# Patient Record
Sex: Female | Born: 1970 | Race: White | Hispanic: No | Smoking: Never smoker
Health system: Southern US, Community
[De-identification: ages and names within clinical notes are randomized; demographics above are authoritative.]

## PROBLEM LIST (undated history)

## (undated) DIAGNOSIS — Z973 Presence of spectacles and contact lenses: Secondary | ICD-10-CM

## (undated) DIAGNOSIS — E785 Hyperlipidemia, unspecified: Secondary | ICD-10-CM

## (undated) DIAGNOSIS — Z8249 Family history of ischemic heart disease and other diseases of the circulatory system: Secondary | ICD-10-CM

## (undated) DIAGNOSIS — K5909 Other constipation: Secondary | ICD-10-CM

## (undated) DIAGNOSIS — Z8711 Personal history of peptic ulcer disease: Secondary | ICD-10-CM

## (undated) DIAGNOSIS — M1990 Unspecified osteoarthritis, unspecified site: Secondary | ICD-10-CM

## (undated) DIAGNOSIS — R011 Cardiac murmur, unspecified: Secondary | ICD-10-CM

## (undated) DIAGNOSIS — F32A Depression, unspecified: Secondary | ICD-10-CM

## (undated) DIAGNOSIS — Z8719 Personal history of other diseases of the digestive system: Secondary | ICD-10-CM

## (undated) DIAGNOSIS — R87619 Unspecified abnormal cytological findings in specimens from cervix uteri: Secondary | ICD-10-CM

## (undated) DIAGNOSIS — Z9884 Bariatric surgery status: Secondary | ICD-10-CM

## (undated) DIAGNOSIS — T7840XA Allergy, unspecified, initial encounter: Secondary | ICD-10-CM

## (undated) DIAGNOSIS — I10 Essential (primary) hypertension: Secondary | ICD-10-CM

## (undated) DIAGNOSIS — T8859XA Other complications of anesthesia, initial encounter: Secondary | ICD-10-CM

## (undated) DIAGNOSIS — K219 Gastro-esophageal reflux disease without esophagitis: Secondary | ICD-10-CM

## (undated) DIAGNOSIS — E039 Hypothyroidism, unspecified: Secondary | ICD-10-CM

## (undated) DIAGNOSIS — T4145XA Adverse effect of unspecified anesthetic, initial encounter: Secondary | ICD-10-CM

## (undated) HISTORY — DX: Gastro-esophageal reflux disease without esophagitis: K21.9

## (undated) HISTORY — DX: Depression, unspecified: F32.A

## (undated) HISTORY — DX: Unspecified osteoarthritis, unspecified site: M19.90

## (undated) HISTORY — DX: Cardiac murmur, unspecified: R01.1

## (undated) HISTORY — PX: ROUX-EN-Y GASTRIC BYPASS: SHX1104

## (undated) HISTORY — PX: CARPAL TUNNEL RELEASE: SHX101

## (undated) HISTORY — DX: Essential (primary) hypertension: I10

## (undated) HISTORY — DX: Allergy, unspecified, initial encounter: T78.40XA

## (undated) HISTORY — PX: TRANSTHORACIC ECHOCARDIOGRAM: SHX275

## (undated) HISTORY — PX: CARDIOVASCULAR STRESS TEST: SHX262

---

## 1997-11-21 ENCOUNTER — Encounter: Admission: RE | Admit: 1997-11-21 | Discharge: 1997-12-19 | Payer: Self-pay | Admitting: Obstetrics and Gynecology

## 1999-10-16 ENCOUNTER — Emergency Department (HOSPITAL_COMMUNITY): Admission: EM | Admit: 1999-10-16 | Discharge: 1999-10-16 | Payer: Self-pay | Admitting: Internal Medicine

## 2000-07-10 ENCOUNTER — Ambulatory Visit (HOSPITAL_COMMUNITY): Admission: RE | Admit: 2000-07-10 | Discharge: 2000-07-10 | Payer: Self-pay | Admitting: General Surgery

## 2000-07-10 HISTORY — PX: ANAL FISSURE REPAIR: SHX2312

## 2001-06-25 ENCOUNTER — Other Ambulatory Visit: Admission: RE | Admit: 2001-06-25 | Discharge: 2001-06-25 | Payer: Self-pay | Admitting: Gynecology

## 2001-12-21 ENCOUNTER — Other Ambulatory Visit: Admission: RE | Admit: 2001-12-21 | Discharge: 2001-12-21 | Payer: Self-pay | Admitting: Gynecology

## 2002-08-20 ENCOUNTER — Other Ambulatory Visit: Admission: RE | Admit: 2002-08-20 | Discharge: 2002-08-20 | Payer: Self-pay | Admitting: Gynecology

## 2002-10-21 HISTORY — PX: BUNIONECTOMY: SHX129

## 2003-08-10 ENCOUNTER — Encounter: Payer: Self-pay | Admitting: Surgery

## 2003-08-12 ENCOUNTER — Ambulatory Visit (HOSPITAL_COMMUNITY): Admission: RE | Admit: 2003-08-12 | Discharge: 2003-08-13 | Payer: Self-pay | Admitting: Surgery

## 2003-08-12 ENCOUNTER — Encounter (INDEPENDENT_AMBULATORY_CARE_PROVIDER_SITE_OTHER): Payer: Self-pay | Admitting: *Deleted

## 2003-08-12 ENCOUNTER — Encounter: Payer: Self-pay | Admitting: Surgery

## 2003-08-12 HISTORY — PX: LAPAROSCOPIC CHOLECYSTECTOMY: SUR755

## 2003-10-12 ENCOUNTER — Other Ambulatory Visit: Admission: RE | Admit: 2003-10-12 | Discharge: 2003-10-12 | Payer: Self-pay | Admitting: Gynecology

## 2004-10-11 ENCOUNTER — Other Ambulatory Visit: Admission: RE | Admit: 2004-10-11 | Discharge: 2004-10-11 | Payer: Self-pay | Admitting: Gynecology

## 2005-10-25 ENCOUNTER — Other Ambulatory Visit: Admission: RE | Admit: 2005-10-25 | Discharge: 2005-10-25 | Payer: Self-pay | Admitting: Gynecology

## 2006-07-14 ENCOUNTER — Ambulatory Visit (HOSPITAL_BASED_OUTPATIENT_CLINIC_OR_DEPARTMENT_OTHER): Admission: RE | Admit: 2006-07-14 | Discharge: 2006-07-14 | Payer: Self-pay | Admitting: Otolaryngology

## 2006-07-14 HISTORY — PX: NASAL SEPTOPLASTY W/ TURBINOPLASTY: SHX2070

## 2006-09-03 ENCOUNTER — Emergency Department (HOSPITAL_COMMUNITY): Admission: EM | Admit: 2006-09-03 | Discharge: 2006-09-03 | Payer: Self-pay | Admitting: Emergency Medicine

## 2006-09-19 ENCOUNTER — Encounter: Admission: RE | Admit: 2006-09-19 | Discharge: 2006-09-19 | Payer: Self-pay | Admitting: *Deleted

## 2007-03-21 ENCOUNTER — Inpatient Hospital Stay (HOSPITAL_COMMUNITY): Admission: EM | Admit: 2007-03-21 | Discharge: 2007-03-23 | Payer: Self-pay | Admitting: Emergency Medicine

## 2007-03-21 HISTORY — PX: OTHER SURGICAL HISTORY: SHX169

## 2009-01-24 ENCOUNTER — Emergency Department (HOSPITAL_COMMUNITY): Admission: EM | Admit: 2009-01-24 | Discharge: 2009-01-24 | Payer: Self-pay | Admitting: Emergency Medicine

## 2009-02-16 ENCOUNTER — Encounter: Admission: RE | Admit: 2009-02-16 | Discharge: 2009-02-16 | Payer: Self-pay | Admitting: Obstetrics and Gynecology

## 2009-02-22 ENCOUNTER — Encounter: Admission: RE | Admit: 2009-02-22 | Discharge: 2009-02-22 | Payer: Self-pay | Admitting: Obstetrics and Gynecology

## 2009-04-20 HISTORY — PX: COMBINED REDUCTION MAMMAPLASTY W/ ABDOMINOPLASTY: SUR306

## 2010-01-05 ENCOUNTER — Emergency Department (HOSPITAL_COMMUNITY): Admission: EM | Admit: 2010-01-05 | Discharge: 2010-01-05 | Payer: Self-pay | Admitting: Emergency Medicine

## 2010-02-23 ENCOUNTER — Encounter: Admission: RE | Admit: 2010-02-23 | Discharge: 2010-02-23 | Payer: Self-pay | Admitting: Obstetrics and Gynecology

## 2011-01-14 LAB — COMPREHENSIVE METABOLIC PANEL
AST: 30 U/L (ref 0–37)
Albumin: 4 g/dL (ref 3.5–5.2)
Chloride: 106 mEq/L (ref 96–112)
Creatinine, Ser: 0.84 mg/dL (ref 0.4–1.2)
GFR calc Af Amer: >60 mL/min (ref >60)
Potassium: 3.7 mEq/L (ref 3.5–5.1)
Total Bilirubin: 1.2 mg/dL (ref 0.3–1.2)
Total Protein: 7.1 g/dL (ref 6.0–8.3)

## 2011-01-14 LAB — DIFFERENTIAL
Basophils Absolute: 0 10*3/uL (ref 0.0–0.1)
Eosinophils Relative: 2 % (ref 0–5)
Lymphocytes Relative: 6 % — ABNORMAL LOW (ref 12–46)
Monocytes Absolute: 0.4 10*3/uL (ref 0.1–1.0)
Monocytes Relative: 3 % (ref 3–12)

## 2011-01-14 LAB — CBC
HCT: 42.9 % (ref 36.0–46.0)
Hemoglobin: 14.4 g/dL (ref 12.0–15.0)
Platelets: 221 10*3/uL (ref 150–400)
WBC: 13.2 10*3/uL — ABNORMAL HIGH (ref 4.0–10.5)

## 2011-01-14 LAB — CK TOTAL AND CKMB (NOT AT ARMC)
CK, MB: 2.2 ng/mL (ref 0.3–4.0)
Total CK: 97 U/L (ref 7–177)

## 2011-01-14 LAB — URINALYSIS, ROUTINE W REFLEX MICROSCOPIC
Glucose, UA: NEGATIVE mg/dL
Specific Gravity, Urine: 1.024 (ref 1.005–1.030)
pH: 6 (ref 5.0–8.0)

## 2011-01-14 LAB — POCT PREGNANCY, URINE: Preg Test, Ur: NEGATIVE

## 2011-01-14 LAB — GLUCOSE, CAPILLARY

## 2011-01-30 LAB — CBC
HCT: 39.6 % (ref 36.0–46.0)
Hemoglobin: 12.9 g/dL (ref 12.0–15.0)
RBC: 4.29 MIL/uL (ref 3.87–5.11)
RDW: 12.5 % (ref 11.5–15.5)
WBC: 7.9 10*3/uL (ref 4.0–10.5)

## 2011-01-30 LAB — URINALYSIS, ROUTINE W REFLEX MICROSCOPIC
Glucose, UA: NEGATIVE mg/dL
Ketones, ur: NEGATIVE mg/dL
Protein, ur: NEGATIVE mg/dL
Urobilinogen, UA: 0.2 mg/dL (ref 0.0–1.0)

## 2011-01-30 LAB — DIFFERENTIAL
Basophils Absolute: 0 10*3/uL (ref 0.0–0.1)
Basophils Relative: 0 % (ref 0–1)
Eosinophils Absolute: 0.3 10*3/uL (ref 0.0–0.7)
Monocytes Relative: 5 % (ref 3–12)
Neutro Abs: 5.1 10*3/uL (ref 1.7–7.7)
Neutrophils Relative %: 65 % (ref 43–77)

## 2011-01-30 LAB — COMPREHENSIVE METABOLIC PANEL
ALT: 23 U/L (ref 0–35)
Alkaline Phosphatase: 51 U/L (ref 39–117)
BUN: 11 mg/dL (ref 6–23)
CO2: 24 mEq/L (ref 19–32)
Chloride: 103 mEq/L (ref 96–112)
GFR calc non Af Amer: >60 mL/min (ref >60)
Glucose, Bld: 96 mg/dL (ref 70–99)
Potassium: 3.8 mEq/L (ref 3.5–5.1)
Sodium: 134 mEq/L — ABNORMAL LOW (ref 135–145)
Total Bilirubin: 1 mg/dL (ref 0.3–1.2)

## 2011-01-30 LAB — URINE MICROSCOPIC-ADD ON

## 2011-01-30 LAB — PREGNANCY, URINE: Preg Test, Ur: NEGATIVE

## 2011-01-30 LAB — URINE CULTURE

## 2011-03-05 NOTE — Consult Note (Signed)
NAMESAMATHA, [name]            ACCOUNT NO.:  [id number]   MEDICAL RECORD NO.:  [id number]          PATIENT TYPE:  EMS   LOCATION:  MAJO                         FACILITY:  [facility]   PHYSICIAN:  Thomas A. Cornett, M.D.DATE OF BIRTH:  1971/06/21   DATE OF CONSULTATION:  DATE OF DISCHARGE:                                 CONSULTATION   CHIEF COMPLAINT:  Abdominal pain.   HISTORY OF PRESENT ILLNESS:  The patient is a 40 year old female that I  was asked to see at the request of Dr. Preston Fleeting in consultation for  abdominal pain.  She has a one day history of initially right lower  quadrant and epigastric abdominal pain.  Now it is more under the  umbilicus.  The pain is severe in intensity and located currently at her  umbilicus with the scale of 7/10.  No associated nausea or vomiting.  She has a history of irritable bowel syndrome she states. She had no  change in her bowel or bladder function.  Her appetite has been good.  CT scan showed an incarcerated periumbilical hernia.   PAST MEDICAL HISTORY:  Irritable bowel disease.   PAST SURGICAL HISTORY:  Laparoscopic cholecystectomy.   FAMILY HISTORY:  Is positive for coronary artery disease, hypertension  and diabetes mellitus.   ALLERGIES:  Erythromycin.   MEDICATIONS:  Currently none.   REVIEW OF SYSTEMS:  Fifteen point review of systems otherwise negative.   PHYSICAL EXAM:  VITAL SIGNS:  Temperature 97.4, pulse 69, blood pressure  111/67, respiratory rate 16.  GENERAL:  White female in no apparent distress.  HEENT:  Extraocular movements are intact.  Oropharynx is moist.  NECK:  Supple.  Nontender.  No mass.  Trachea midline.  CHEST:  Clear to auscultation.  Chest wall motion is normal.  CARDIOVASCULAR:  Regular rate and rhythm without rub, murmur or gallop.  Peripheral perfusion is normal.  ABDOMEN:  There is a mass in her umbilicus which is tender and non-  reducible.  No peritonitis.  No other mass noted.  Bowel sounds are  present.  EXTREMITIES:  No clubbing, cyanosis or edema.  Motor and sensory  function is grossly intact.  NEUROLOGIC:  Glasgow coma scale was 15.  Motor and sensory function  grossly intact.   DIAGNOSTIC STUDIES:  He has a white count of 12,600, hemoglobin of 14,  platelet count 334,000.  The sodium 134, potassium 3.4, chloride 100,  CO2 of 24, BUN 18, creatinine 0.8, glucose 83.  Abdominal pelvic CT scan  are as stated above.   IMPRESSION:  Incarcerated periumbilical hernia more than likely an  incisional hernia from previous laparoscopic cholecystectomy with  incarcerated bowel.   PLAN:  I recommend exploratory laparotomy for reduction of this and  repair emergently.  I have explained the risks of procedure to include  bleeding, infection, bowel resection and subsequent more surgery.  They  understand the above and wished to proceed.      Thomas A. Cornett, M.D.  Electronically Signed     TAC/MEDQ  D:  03/21/2007  T:  03/21/2007  Job:  [id number]

## 2011-03-05 NOTE — Op Note (Signed)
[name], [name]            ACCOUNT NO.:  [id number]   MEDICAL RECORD NO.:  [id number]          PATIENT TYPE:  INP   LOCATION:  2550                         FACILITY:  [facility]   PHYSICIAN:  Maisie Fus A. Cornett, M.D.DATE OF BIRTH:  1971/10/18   DATE OF PROCEDURE:  03/21/2007  DATE OF DISCHARGE:                               OPERATIVE REPORT   PREOPERATIVE DIAGNOSIS:  Incarcerated periumbilical hernia.   POSTOPERATIVE DIAGNOSIS:  Spontaneously reduced periumbilical hernia.   PROCEDURE:  Repair of periumbilical hernia with Ventralex mesh.   SURGEON:  Harriette Bouillon, M.D.   ANESTHESIA:  General endotracheal anesthesia.   ESTIMATED BLOOD LOSS:  30 mL.   SPECIMENS:  None.   DRAINS:  None.   INDICATIONS FOR PROCEDURE:  The patient is a 40 year old female who  presents with about an 8-10-hour history of periumbilical and abdominal  pain.  By CT, she is noted to have an incarcerated periumbilical hernia,  with what appeared to be pericolonic fat and possibly part of the  transverse colon.  She had a mild white count.  On examination she was  tender.  She had no signs of obstruction.  She was brought emergently to  the operating room for repair this.  The procedure was discussed with  the patient, as well as potential complications of bleeding, infection,  injury to bowel and recurrence.  She understood and agreed to proceed.   DESCRIPTION OF PROCEDURE:  The patient was brought to the operating  suite and placed supine.  After induction of general anesthesia, the  hernia was located just below her umbilicus.  After sterile prep and  drape, an incision was made through a previous scar from a laparoscopic  cholecystectomy site.  We dissected down and I found the hernia sac and  dissected this out away from all the subcutaneous fatty tissue.  I had  to find the defect.  It was approximately 1.5 cm in maximal diameter.  I  was able to then reduce the hernia sac back in.  Of note, the  contents  reduced prior to incision.  This may have happened when they induced  her, and she had spontaneous reduction of the hernia.  In any event, I  elected not to open the sac since it had already reduced on its own.  I  use Kocher clamps to grab the fascial edge of the defect and then, used  my finger to bluntly creatinine a space below the fascia.  A 4.3-cm  Ventralex mesh was placed, the smooth side down on top of the  peritoneum.  I secured it to the fascial edge with  number 1 Novofil.  I  then closed the tissue over the mesh to cover with number 1 Novofil.  Irrigation was used and suctioned out.  Hemostasis was excellent.  I  closed the subcutaneous fat with 3-0 Vicryl.  Monocryl 4-0 was used to close the skin.  Steri-Strips and dry dressings  were applied.  All final counts of sponge, needle and instrument were  found be correct for this portion of the case.  The patient was awakened  and taken  to recovery in satisfactory condition.      Thomas A. Cornett, M.D.  Electronically Signed     TAC/MEDQ  D:  03/21/2007  T:  03/21/2007  Job:  [id number]

## 2011-03-08 NOTE — Op Note (Signed)
[name], [name]            ACCOUNT NO.:  [id number]   MEDICAL RECORD NO.:  [id number]          PATIENT TYPE:  AMB   LOCATION:  DSC                          FACILITY:  [facility]   PHYSICIAN:  Karol T. Lazarus Salines, M.D. DATE OF BIRTH:  Oct 27, 1970   DATE OF PROCEDURE:  07/14/2006  DATE OF DISCHARGE:                                 OPERATIVE REPORT   PREOPERATIVE DIAGNOSIS:  1. Nasal septal deviation.  2. Anterior septal perforation.  3. Bilateral hypertrophic inferior turbinates.   POSTOPERATIVE DIAGNOSIS:  1. Nasal septal deviation.  2. Anterior septal perforation.  3. Bilateral hypertrophic inferior turbinates.   PROCEDURE PERFORMED:  1. Nasal septoplasty.  2. Repair nasal septal perforation with local rotation and transposition      flaps.  3. Bilateral submucous resection inferior turbinates.   SURGEON:  Gloris Manchester. Lazarus Salines, M.D.   ANESTHESIA:  General orotracheal.   BLOOD LOSS:  Minimal.   COMPLICATIONS:  None.   FINDINGS:  Overall rightward septal deviation with relatively thin  quadrangular cartilage.  A 1-1.2 cm clean perforation of the anterior  inferior quadrangular cartilage and nasal septum.  Moderate sized inferior  turbinates and overall narrow nose.   PROCEDURE:  With the patient in a comfortable supine position, general  orotracheal anesthesia was induced without difficulty.  At an appropriate  level, the patient was placed in a slight sitting position.  The head was  rotated to the right for access to the nose.  A saline moistened throat pack  was placed.  Nasal vibrissae were trimmed.  Cocaine crystals, 160 mg total,  were applied on cotton carriers to the anterior ethmoid and sphenopalatine  ganglion regions on both sides.  Cocaine solution, 160 mg were applied, on  1/2 x 3 inch cottonoids to both sides of the septal mucosa.  1% Xylocaine  with 1:100,000 epinephrine, 10 mL total, was infiltrated into the anterior  floor of the nose, into the membranous  columella, and into the anterior  submucoperichondrial plane of the nasal septum on both sides.  A sterile  preparation and draping of the mid face was accomplished.   The materials were removed from the nose and observed to be intact and  correct in number.  The nose was carefully inspected with the findings as  described above.  A left-sided hemitransfixion incision was sharply executed  and carried down to the caudal edge of the quadrangular cartilage and  continued onto a floor incision.  A small right anterior floor incision was  executed.  Floor tunnels were elevated on both sides, carried posteriorly in  the floor of the nose, and brought up onto the vomer and brought forward.  The submucoperichondrial plane of the left anterior septum was elevated and  the perforation was dissected around its periphery and then the dissection  was carried into the perforation proper.  Dissection was carried further  posteriorly beyond the perforation on the quadrangular cartilage then up  onto the perpendicular plate of ethmoid.  The dissection was carried up to  the vault of the nose and down to the floor the nose and then brought  forward along the maxillary crest.  Assessment of the perforation was now  further possible.  2 mm of quadrangular cartilage along the maxillary crest  was incised to remove moderately heavy spurring in this locale.  This  communicated with the inferior edge of the perforation.  A tongue blade  shaped piece of quadrangular cartilage posterior to the perforation was  harvested back into the perpendicular plate of the ethmoid and was saved for  later use.  The contour ethmoid junction was identified and the opposite  submucoperiosteal plane of the posterior septum was elevated up to the vault  of the nose and back down to the floor.  The superior perpendicular plate of  ethmoid was lysed with an open Jansen-Middleton forceps and more inferior  portions were submucosally  dissected and then rocked free with Takahashi  forceps or with the closed Jansen-Middleton forceps.  Special attention was  placed in the region of nasion to allow the septum to swing more freely in  the midline without collapsing.  The maxillary crest was narrowed using a  sharp osteotome, removing the wings on both sides but was not lowered in its  total vertical height.  At this point, the septum was relatively straight  and could be swung into the midline.  Attention to the perforation was now  possible.  The submucoperichondrial plane of the right septum was elevated  from the perforation backwards and down to communicate with the floor  tunnel.  A linear incision was made in the floor of the nose along the  maxillary crest and then a relaxing incision approximately 1 cm in front of  the sphenoid rostrum was made vertically allowing this flap to rotate  forward.  It was rotated forward and with minimal tension, the perforation  was closed with interrupted 4-0 chromic suture.  A raw space was left in the  floor of the nose and on the posterior septum.   Addressing the left side, transposition flaps superiorly and inferiorly were  felt indicated.  A linear incision was made in the vault of the nose and  dissected into the septal tunnel.  A linear incision was made in the lateral  wall of the nose in the inferior meatus and this mucosa was dissected upward  so it could be transposed medially.  These two flaps were brought together  and closed across the perforation in a linear fashion horizontally.  There  was minimal tension.  Again, a raw space was left in the vault the nose and  also in the inferior meatus.   Prior to closing the left-sided perforation, the tongue of cartilage which  had been harvested was fashioned to fit into the residual cartilage of the  perforation and was placed.  The bony perpendicular plate portion was removed and discarded.  The cartilage was secured to the  remaining  quadrangular cartilage with interrupted 4-0 chromic sutures in the standard  fashion.  At this point, the septum was intact lying in the midline and did  not seem to need to be secured.  After completing both septal flaps, the  anterior incisions were closed with interrupted 4-0 chromic suture.  The  septum was quilted with running simple 5-0 plain gut stitch.  Adequate  septoplasty and closure of the perforation was felt to have been  accomplished.   Just prior to completing the septoplasty, the inferior turbinates were  infiltrated with a total of 3 mL each 1% Xylocaine with 1:100,000  epinephrine.  After  completing the septoplasty, beginning on the right side,  the anterior hood of the inferior turbinate was sharply incised just behind  the nasal valve.  The medial mucosa of the turbinate was incised in an  anterior upsloping fashion.  A laterally based flap was developed.  The  turbinate was infractured. Using angled turbinate scissors, the turbinate  anterior pole bone and mucosa was resected in a posterior downsloping  fashion leaving virtually all the posterior pole and taking virtually all  the anterior pole.  Bony spicules were removed for more prompt healing.  The  flap was laid back down.  The bulbous posterior pole of the cut mucosal  edges were suction coagulated..  The turbinate was outfractured.  This  completed the right inferior turbinate resection.  After completing the  right side, the left side was done in an identical fashion.  At this point  the turbinate reduction and septoplasty was completed.  The septum was  straight and in the midline.  Hemostasis was observed.   0.04 reinforced Silastic splints were fashioned and placed against the nasal  septum on both sides and secured anteriorly with a 3-0 Ethilon stitch.  Triple thickness Telfa packs impregnated with Neosporin ointment were  fashioned and placed low in the nose on both sides to support the  septum and  to provide hemostasis against the turbinates.  At this point, the procedure  was completed.  The pharynx was suctioned clean and the throat pack was  removed.  Again, hemostasis was observed.  The patient was returned to  anesthesia, awakened, extubated, and transferred to recovery in stable  condition.   COMMENT:  40 year old white female with a presumed traumatic anterior septal  perforation was the indication for today's procedure.  Anticipated routine  postoperative recovery with attention to analgesia, antibiosis, ice, and  elevation.  We will remove the nasal packing in one day and the septal  splints in ten days.  Given the low anticipated risk of postanesthetic or  postsurgical complications, I feel an outpatient venue is appropriate.      Gloris Manchester. Lazarus Salines, M.D.  Electronically Signed     KTW/MEDQ  D:  07/14/2006  T:  07/15/2006  Job:  [id number]   cc:   Marcene Duos, M.D.

## 2011-03-08 NOTE — Op Note (Signed)
NAMESEANNE, [name]                        ACCOUNT NO.:  [id number]   MEDICAL RECORD NO.:  [id number]                   PATIENT TYPE:  OIB   LOCATION:  2550                                 FACILITY:  [facility]   PHYSICIAN:  Velora Heckler, M.D.                DATE OF BIRTH:  1971-05-01   DATE OF PROCEDURE:  08/12/2003  DATE OF DISCHARGE:                                 OPERATIVE REPORT   PREOPERATIVE DIAGNOSIS:  Symptomatic cholelithiasis.   POSTOPERATIVE DIAGNOSIS:  Symptomatic cholelithiasis.   PROCEDURE:  Laparoscopic cholecystectomy  with intraoperative  cholangiography.   SURGEON:  Velora Heckler, M.D.   ASSISTANT:  Anselm Pancoast. Zachery Dakins, M.D.   ANESTHESIA:  General per Judie Petit, M.D.   ESTIMATED BLOOD LOSS:  Minimal.   PREPARATION:  Betadine.   COMPLICATIONS:  None.   INDICATIONS FOR PROCEDURE:  The patient is a 40 year old white female who  presents with a history of  abdominal pain. The patient had undergone  ultrasound at [facility] demonstrating numerous gallstones. The  patient had had a slight elevation of her total bilirubin, but other  liver  function tests were normal. The patient now comes to surgery for  cholecystectomy.   DESCRIPTION OF PROCEDURE:  The procedure was done in operating room #17 at  the [facility]. [facility]. The patient was brought to the  operating room and placed in the supine position on the operating table.  Following  administration of general anesthesia the patient is prepped and  draped in the usual strict aseptic fashion.   After ascertaining that an adequate level of anesthesia had been obtained,  an infraumbilical incision was made in the midline with a #15 blade.  Dissection was carried down to the fascia. The fascia was incised and the  peritoneal cavity was entered cautiously. A 0 Vicryl pursestring suture was  placed in the fascia. An Hasson cannula was introduced under direct vision  and  secured with a pursestring suture. The abdomen is insufflated with CO2.   The laparoscope was introduced and the abdomen is explored. Operative ports  were placed along the right costal margin in the midline, midclavicular  line, and the anterior axillary line. The fundus of the gallbladder  is  grasped and retracted  cephalad. Dissection is begun at the neck of  the  gallbladder.   The cystic duct is dissected out along its length. It is quite small. A clip  is placed at the neck  of the gallbladder. The cystic duct is incised. The  clip cholangiography catheter is introduced through a stab wound in the  right upper quadrant. The catheter barely fits in the lumen of the cystic  duct. It is secured with a Ligaclip.   Using C-arm fluoroscopy real time cholangiography is performed. The cystic  duct enters the right hepatic duct as an anatomic variant. Otherwise the  cholangiogram  was normal with  no filling defects or evidence of  obstruction.   The clip and catheter are withdrawn. The cystic duct is triply clipped and  divided. The anterior branch of the cystic artery is dissected out, doubly  clipped and divided. The posterior  branch of the cystic artery is dissected  out, doubly clipped and divided.   The gallbladder  is then  excised from the gallbladder  bed using the hook  electrocautery for hemostasis. The gallbladder  is completely excised and  placed into an EndoCatch bag. It is withdrawn through the umbilical port  without difficulty. A 0 Vicryl pursestring suture  is tied securely.   The right upper quadrant is copiously irrigated with warm saline which is  evacuated. Good hemostasis is noted. Pneumoperitoneum is released and ports  are removed. The port sites are inspected for good hemostasis. All port  sites were anesthetized with local anesthetic. All wounds were closed with  interrupted 4-0 Vicryl subcuticular sutures. The wounds are washed and dried  and Steri-Strips  are applied. Sterile gauze dressings are applied.   The patient was awakened from anesthesia and brought to the recovery room in  stable condition. The patient tolerated the procedure well.                                                 Velora Heckler, M.D.    TMG/MEDQ  D:  08/12/2003  T:  08/12/2003  Job:  [id number]   cc:   Marcene Duos, M.D.  [address] [place]  [place] [address]  Fax: [phone]

## 2011-03-08 NOTE — Op Note (Signed)
[facility]  Patient:    [name], [name]                     MRN: [id number] Proc. Date: 07/10/00 Adm. Date:  [id number] Disc. Date: [id number] Attending:  Carson Myrtle                           Operative Report  PREOPERATIVE DIAGNOSIS:  Anal fissure.  POSTOPERATIVE DIAGNOSIS:  Anal fissure.  PROCEDURE:  Repair of anal fissure.  SURGEON:  Kendrick Ranch, M.D.  ANESTHESIA:  General.  HISTORY OF PRESENT ILLNESS:  This 41 year old Caucasian female has had problems with constipation, difficult bowel movements and a recent pregnancy resulting in hemorrhoids and fissure. The hemorrhoids are quiescent, the fissure has been persistent. She has developed mild stenosis and spasm. She has been carefully counseled a number of times in the office and she wishes to proceed with surgery as has been carefully explained to her.  DESCRIPTION OF PROCEDURE:  The patient was taken to the operating room, placed supine, LMA anesthesia provided. She was placed in lithotomy, perianal area inspected, prepped and draped in the usual fashion. 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 The anus was injected round and about with 0.5% Marcaine with epinephrine and massaged in well. Anoscopy was completed. The liquid stool was removed. With digital exam, the internal sphincter was easily separable from the external and then placing a 15 blade percutaneously, the internal sphincter was divided. This allowed for easy stretch of the anus. The fissure was debrided and cauterized. We had elected not to treat the hemorrhoids and we did not do so today. There was no other pathology. The procedure was complete. Gelfoam gauze and dry sterile dressing applied. The patient tolerated the procedure well and was removed to the recovery room in good  condition.  Written and verbal instructions including Percocet #36 were given. She will be seen and followed as an outpatient. DD:  07/10/00 TD:  07/12/00 Job: [id number]

## 2011-06-28 ENCOUNTER — Other Ambulatory Visit: Payer: Self-pay | Admitting: Obstetrics and Gynecology

## 2011-06-28 DIAGNOSIS — N6325 Unspecified lump in the left breast, overlapping quadrants: Secondary | ICD-10-CM

## 2011-07-01 ENCOUNTER — Ambulatory Visit
Admission: RE | Admit: 2011-07-01 | Discharge: 2011-07-01 | Disposition: A | Discharge status: Home / Self Care | Payer: BC Managed Care – PPO | Source: Ambulatory Visit | Attending: Obstetrics and Gynecology | Admitting: Obstetrics and Gynecology

## 2011-07-01 ENCOUNTER — Other Ambulatory Visit: Payer: Self-pay | Admitting: Obstetrics and Gynecology

## 2011-07-01 DIAGNOSIS — N6325 Unspecified lump in the left breast, overlapping quadrants: Secondary | ICD-10-CM

## 2011-08-08 LAB — COMPREHENSIVE METABOLIC PANEL
ALT: 21
BUN: 4 — ABNORMAL LOW
Calcium: 8.7
Glucose, Bld: 97
Sodium: 139
Total Protein: 5.1 — ABNORMAL LOW

## 2011-08-08 LAB — CBC
HCT: 30.4 — ABNORMAL LOW
Hemoglobin: 11.5 — ABNORMAL LOW
MCHC: 34.3
MCV: 89
Platelets: 220
RBC: 3.41 — ABNORMAL LOW
RDW: 12.1
WBC: 7.1

## 2011-12-19 ENCOUNTER — Ambulatory Visit (INDEPENDENT_AMBULATORY_CARE_PROVIDER_SITE_OTHER): Payer: BC Managed Care – PPO | Admitting: Obstetrics and Gynecology

## 2011-12-19 DIAGNOSIS — Z01419 Encounter for gynecological examination (general) (routine) without abnormal findings: Secondary | ICD-10-CM

## 2011-12-31 ENCOUNTER — Other Ambulatory Visit (INDEPENDENT_AMBULATORY_CARE_PROVIDER_SITE_OTHER): Payer: BC Managed Care – PPO

## 2011-12-31 DIAGNOSIS — R5381 Other malaise: Secondary | ICD-10-CM

## 2012-02-06 ENCOUNTER — Ambulatory Visit (INDEPENDENT_AMBULATORY_CARE_PROVIDER_SITE_OTHER): Payer: BC Managed Care – PPO | Admitting: Obstetrics and Gynecology

## 2012-02-06 ENCOUNTER — Encounter: Payer: Self-pay | Admitting: Obstetrics and Gynecology

## 2012-02-06 VITALS — BP 112/74 | HR 76 | Wt 198.0 lb

## 2012-02-06 DIAGNOSIS — N951 Menopausal and female climacteric states: Secondary | ICD-10-CM

## 2012-02-06 DIAGNOSIS — Z9884 Bariatric surgery status: Secondary | ICD-10-CM | POA: Insufficient documentation

## 2012-02-06 DIAGNOSIS — R5381 Other malaise: Secondary | ICD-10-CM

## 2012-02-06 DIAGNOSIS — R5383 Other fatigue: Secondary | ICD-10-CM

## 2012-02-06 DIAGNOSIS — E669 Obesity, unspecified: Secondary | ICD-10-CM

## 2012-02-06 MED ORDER — NUVARING 0.12-0.015 MG/24HR VA RING: VAGINAL_RING | VAGINAL | Status: DC

## 2012-02-06 MED ORDER — INOSITOL-FOLIC ACID 2000-200 MG-MCG PO PACK: 1.0000 | PACK | Freq: Every day | ORAL | Status: DC

## 2012-02-06 NOTE — Progress Notes (Signed)
Problems:  Fatigue                    Menopausal sx  VS:  Reviewed  Labs:  Reviewed with patient            Of note, Estradiol <11, in menopausal range  Impression:Perimenopausal labs and sx  Rec:  Continuous Nuvaring           T3, T4 per patient request            Increase exercise.           FU 3 months

## 2012-02-19 ENCOUNTER — Telehealth: Payer: Self-pay | Admitting: Obstetrics and Gynecology

## 2012-02-19 NOTE — Telephone Encounter (Signed)
Tc to pt. Told pt Free T3 and T4, and T3 uptake-wnl. Will consult with vph rgdg any additional recs. Pt voices understanding.

## 2012-02-19 NOTE — Telephone Encounter (Signed)
Routed to chandra 

## 2012-02-26 ENCOUNTER — Telehealth: Payer: Self-pay

## 2012-02-26 NOTE — Telephone Encounter (Signed)
Tc to pt per vph recs. Told pt to cont Nuvaring continuously, increase exercise, and fu with vph in 3 months per last ov recs. Pt voices understanding. Pt on recall list for reminder in 04/2012 for fu.

## 2012-06-18 ENCOUNTER — Telehealth: Payer: Self-pay | Admitting: Obstetrics and Gynecology

## 2012-06-18 MED ORDER — NUVARING 0.12-0.015 MG/24HR VA RING: VAGINAL_RING | VAGINAL | Status: DC

## 2012-06-18 NOTE — Telephone Encounter (Signed)
TRIAGE/BC REQ °

## 2012-06-18 NOTE — Telephone Encounter (Signed)
Tc to pt per telephone call. Rx Nuvaring on file e-pres to pharm on file due to pt losing rx given in 01/2012. Pt has not missed any ring insertions. Pt has been using Nuvaring as directed due to having rf's left from a prev rx. Pt voices understanding.

## 2012-06-18 NOTE — Telephone Encounter (Signed)
[facility] pt 

## 2012-07-27 ENCOUNTER — Emergency Department (HOSPITAL_COMMUNITY): Payer: BC Managed Care – PPO

## 2012-07-27 ENCOUNTER — Encounter (HOSPITAL_COMMUNITY): Payer: Self-pay | Admitting: Emergency Medicine

## 2012-07-27 ENCOUNTER — Observation Stay (HOSPITAL_COMMUNITY)
Admission: EM | Admit: 2012-07-27 | Discharge: 2012-07-28 | Disposition: A | Discharge status: Home / Self Care | Payer: BC Managed Care – PPO | Attending: Internal Medicine | Admitting: Internal Medicine

## 2012-07-27 DIAGNOSIS — R110 Nausea: Secondary | ICD-10-CM | POA: Insufficient documentation

## 2012-07-27 DIAGNOSIS — E785 Hyperlipidemia, unspecified: Secondary | ICD-10-CM | POA: Insufficient documentation

## 2012-07-27 DIAGNOSIS — I2510 Atherosclerotic heart disease of native coronary artery without angina pectoris: Secondary | ICD-10-CM | POA: Insufficient documentation

## 2012-07-27 DIAGNOSIS — R079 Chest pain, unspecified: Principal | ICD-10-CM | POA: Insufficient documentation

## 2012-07-27 DIAGNOSIS — Z8249 Family history of ischemic heart disease and other diseases of the circulatory system: Secondary | ICD-10-CM | POA: Insufficient documentation

## 2012-07-27 HISTORY — DX: Hyperlipidemia, unspecified: E78.5

## 2012-07-27 LAB — CREATININE, SERUM
Creatinine, Ser: 0.91 mg/dL (ref 0.50–1.10)
GFR calc non Af Amer: 76 mL/min — ABNORMAL LOW (ref >90)

## 2012-07-27 LAB — TROPONIN I: Troponin I: <0.3 ng/mL (ref <0.30)

## 2012-07-27 LAB — CBC
Hemoglobin: 13.6 g/dL (ref 12.0–15.0)
MCH: 30.2 pg (ref 26.0–34.0)
MCHC: 34.3 g/dL (ref 30.0–36.0)
RDW: 12.4 % (ref 11.5–15.5)

## 2012-07-27 LAB — CBC WITH DIFFERENTIAL/PLATELET
Basophils Absolute: 0.1 10*3/uL (ref 0.0–0.1)
Basophils Relative: 1 % (ref 0–1)
MCHC: 33.4 g/dL (ref 30.0–36.0)
Monocytes Absolute: 0.3 10*3/uL (ref 0.1–1.0)
Neutro Abs: 7.6 10*3/uL (ref 1.7–7.7)
Neutrophils Relative %: 78 % — ABNORMAL HIGH (ref 43–77)
RDW: 12.5 % (ref 11.5–15.5)

## 2012-07-27 LAB — POCT I-STAT TROPONIN I

## 2012-07-27 LAB — COMPREHENSIVE METABOLIC PANEL
ALT: 23 U/L (ref 0–35)
Alkaline Phosphatase: 82 U/L (ref 39–117)
CO2: 23 mEq/L (ref 19–32)
GFR calc Af Amer: >90 mL/min (ref >90)
GFR calc non Af Amer: 85 mL/min — ABNORMAL LOW (ref >90)
Glucose, Bld: 89 mg/dL (ref 70–99)
Potassium: 4.3 mEq/L (ref 3.5–5.1)
Sodium: 139 mEq/L (ref 135–145)
Total Bilirubin: 0.6 mg/dL (ref 0.3–1.2)

## 2012-07-27 LAB — CK TOTAL AND CKMB (NOT AT ARMC): Total CK: 82 U/L (ref 7–177)

## 2012-07-27 LAB — MAGNESIUM: Magnesium: 2.2 mg/dL (ref 1.5–2.5)

## 2012-07-27 MED ORDER — NITROGLYCERIN 2 % TD OINT
0.5000 [in_us] | TOPICAL_OINTMENT | Freq: Four times a day (QID) | TRANSDERMAL | Status: DC
  Administered 2012-07-27 – 2012-07-28 (×4): 0.5 [in_us] via TOPICAL
  Filled 2012-07-27: qty 30
  Filled 2012-07-27: qty 1

## 2012-07-27 MED ORDER — ONDANSETRON HCL 4 MG PO TABS: 4.0000 mg | ORAL_TABLET | Freq: Four times a day (QID) | ORAL | Status: DC | PRN

## 2012-07-27 MED ORDER — DOCUSATE SODIUM 100 MG PO CAPS
100.0000 mg | ORAL_CAPSULE | Freq: Two times a day (BID) | ORAL | Status: DC
  Administered 2012-07-27 – 2012-07-28 (×2): 100 mg via ORAL
  Filled 2012-07-27 (×3): qty 1

## 2012-07-27 MED ORDER — ASPIRIN EC 81 MG PO TBEC
81.0000 mg | DELAYED_RELEASE_TABLET | Freq: Every day | ORAL | Status: DC
  Administered 2012-07-28: 81 mg via ORAL
  Filled 2012-07-27 (×2): qty 1

## 2012-07-27 MED ORDER — PANTOPRAZOLE SODIUM 40 MG PO TBEC
40.0000 mg | DELAYED_RELEASE_TABLET | Freq: Every day | ORAL | Status: DC
  Administered 2012-07-27: 40 mg via ORAL
  Filled 2012-07-27: qty 1

## 2012-07-27 MED ORDER — ACETAMINOPHEN 325 MG PO TABS
650.0000 mg | ORAL_TABLET | Freq: Four times a day (QID) | ORAL | Status: DC | PRN
  Administered 2012-07-28: 650 mg via ORAL
  Filled 2012-07-27: qty 2

## 2012-07-27 MED ORDER — SODIUM CHLORIDE 0.9 % IJ SOLN: 3.0000 mL | Freq: Two times a day (BID) | INTRAMUSCULAR | Status: DC

## 2012-07-27 MED ORDER — ATORVASTATIN CALCIUM 20 MG PO TABS
20.0000 mg | ORAL_TABLET | Freq: Every day | ORAL | Status: DC
  Administered 2012-07-27 – 2012-07-28 (×2): 20 mg via ORAL
  Filled 2012-07-27 (×2): qty 1

## 2012-07-27 MED ORDER — MORPHINE SULFATE 2 MG/ML IJ SOLN: 2.0000 mg | INTRAMUSCULAR | Status: DC | PRN

## 2012-07-27 MED ORDER — SODIUM CHLORIDE 0.9 % IV SOLN
INTRAVENOUS | Status: DC
  Administered 2012-07-27: 20 mL/h via INTRAVENOUS

## 2012-07-27 MED ORDER — ACETAMINOPHEN 650 MG RE SUPP: 650.0000 mg | Freq: Four times a day (QID) | RECTAL | Status: DC | PRN

## 2012-07-27 MED ORDER — ALPRAZOLAM 0.25 MG PO TABS
0.2500 mg | ORAL_TABLET | Freq: Two times a day (BID) | ORAL | Status: DC | PRN
  Administered 2012-07-27: 0.25 mg via ORAL
  Filled 2012-07-27: qty 1

## 2012-07-27 MED ORDER — ZOLPIDEM TARTRATE 5 MG PO TABS
10.0000 mg | ORAL_TABLET | Freq: Every day | ORAL | Status: DC
  Administered 2012-07-27: 10 mg via ORAL
  Filled 2012-07-27: qty 2

## 2012-07-27 MED ORDER — METOPROLOL TARTRATE 25 MG PO TABS
25.0000 mg | ORAL_TABLET | Freq: Two times a day (BID) | ORAL | Status: DC
  Administered 2012-07-27 – 2012-07-28 (×2): 25 mg via ORAL
  Filled 2012-07-27 (×3): qty 1

## 2012-07-27 MED ORDER — NITROGLYCERIN 0.4 MG SL SUBL
0.4000 mg | SUBLINGUAL_TABLET | SUBLINGUAL | Status: DC | PRN
  Administered 2012-07-28: 0.4 mg via SUBLINGUAL

## 2012-07-27 MED ORDER — KETOROLAC TROMETHAMINE 30 MG/ML IJ SOLN
30.0000 mg | Freq: Four times a day (QID) | INTRAMUSCULAR | Status: DC
  Administered 2012-07-27 – 2012-07-28 (×3): 30 mg via INTRAVENOUS
  Filled 2012-07-27 (×7): qty 1

## 2012-07-27 MED ORDER — ONDANSETRON HCL 4 MG/2ML IJ SOLN: 4.0000 mg | Freq: Four times a day (QID) | INTRAMUSCULAR | Status: DC | PRN

## 2012-07-27 MED ORDER — HEPARIN SODIUM (PORCINE) 5000 UNIT/ML IJ SOLN
5000.0000 [IU] | Freq: Three times a day (TID) | INTRAMUSCULAR | Status: DC
  Administered 2012-07-27 – 2012-07-28 (×3): 5000 [IU] via SUBCUTANEOUS
  Filled 2012-07-27 (×5): qty 1

## 2012-07-27 MED ORDER — INFLUENZA VIRUS VACC SPLIT PF IM SUSP
0.5000 mL | INTRAMUSCULAR | Status: DC
Start: 2012-07-28 — End: 2012-07-28
  Filled 2012-07-27: qty 0.5

## 2012-07-27 NOTE — ED Provider Notes (Signed)
MSE was initiated and I personally evaluated the patient at  5:19 PM on July 27, 2012.  The patient appears stable, and Dr. Rennis Golden is here seeing the patient and will admit her.  She had presented with chest pain and negative troponin. Lungs are clear and heart has regular rate and rhythm with 1-2/6 systolic ejection murmur heard at the cardiac base. She is currently pain free and in no distress.   Dione Booze, MD 07/27/12 [phone]

## 2012-07-27 NOTE — H&P (Signed)
Pt. Seen and examined. Agree with the NP/PA-C note as written. 41 yo female with few cardiac risk factors, + anxiety, recent palpitations and family history of CAD. Nausea, right arm pain and right chest pain. Pain was sharp. She is perimenopausal and having hot flashes. She was scheduled for a stress test tomorrow in the office, but presented to the ER due to ongoing concern for chest pain.  Initial evaluation reveals negative cardiac enzymes, EKG without ischemic changes. Plan R/O ACS overnight and CT angiogram tomorrow morning.   Chrystie Nose, MD, [facility] Attending Cardiologist [facility]

## 2012-07-27 NOTE — H&P (Signed)
[name] is an 41 y.o. female.   Chief Complaint: chest pain  HPI: [name] presented to ER due chest pain that increased today. Over last 3-4 weeks she has had substernal chest discomfort.  Today it increased and she had mild nausea and some discomfort in rt. Arm.  Mostly felt like a chest pressure/tightness, then she did have occ. Sharp pain.  She also complained of tachycardia. With her family history she and Dr. Allyson Garza were concerned about CAD, she had been scheduled for a Met Test in th AM.  Now in the ER without acute changes in EKG, initial cardiac troponin is negative.    Chest discomfort is ongoing.  Dr. Rennis Garza has seen the patient and discussed other testing than met test.  Plan is for cardiac CT in the Am.  He believes her to be at lower risk for CAD, but with current pain she would not tolerate a met test or treadmill study.  We will admit to obs.  Add PPI, IV toradol and lopressor, along with NTG paste.   Will use subcu Heparin for Vte prophylaxis.     Past Medical History  Diagnosis Date  . Abnormal Pap smear   . High cholesterol   . Chest pain at rest, with strong family history of CAD. 07/27/2012  . Hyperlipidemia 07/27/2012    Past Surgical History  Procedure Date  . Cholecystectomy     2003  . Hernia repair     2008  . Foot surgery     2004  . Carpal tunnel release     2001  . Cosmetic surgery     2009    Family History  Problem Relation Age of Onset  . Diabetes Paternal Grandfather   . Breast cancer Paternal Grandmother   . Breast cancer Maternal Grandmother   . Diabetes Father   . Coronary artery disease Father   . Coronary artery disease Mother    Social History:  reports that she has never smoked. She has never used smokeless tobacco. She reports that she does not drink alcohol or use illicit drugs. 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  Allergies: No Known Allergies  Outpatient Medications: Nuvaring-vaginal  ring Lipitor was increased to 40 mg daily in the office  Prilosec 20 mg daily  meloxicam 15 mg daily  stool softener daily multivitamin daily and Metabolite vitamin daily   Results for orders placed during the hospital encounter of 07/27/12 (from the past 48 hour(s))  COMPREHENSIVE METABOLIC PANEL     Status: Abnormal   Collection Time   07/27/12  1:49 PM      Component Value Range Comment   Sodium 139  135 - 145 mEq/L    Potassium 4.3  3.5 - 5.1 mEq/L    Chloride 103  96 - 112 mEq/L    CO2 23  19 - 32 mEq/L    Glucose, Bld 89  70 - 99 mg/dL    BUN 8  6 - 23 mg/dL    Creatinine, Ser 4.09  0.50 - 1.10 mg/dL    Calcium 81.1  8.4 - 10.5 mg/dL    Total Protein 7.6  6.0 - 8.3 g/dL    Albumin 4.1  3.5 - 5.2 g/dL    AST 21  0 - 37 U/L    ALT 23  0 - 35 U/L    Alkaline Phosphatase 82  39 - 117 U/L    Total Bilirubin 0.6  0.3 - 1.2 mg/dL    GFR calc non Af Amer 85 (*) >90 mL/min    GFR calc Af Amer >90  >90 mL/min   CBC WITH DIFFERENTIAL     Status: Abnormal   Collection Time   07/27/12  1:49 PM      Component Value Range Comment   WBC 9.9  4.0 - 10.5 K/uL    RBC 4.60  3.87 - 5.11 MIL/uL    Hemoglobin 13.6  12.0 - 15.0 g/dL    HCT 16.1  09.6 - 04.5 %    MCV 88.5  78.0 - 100.0 fL    MCH 29.6  26.0 - 34.0 pg    MCHC 33.4  30.0 - 36.0 g/dL    RDW 40.9  81.1 - 91.4 %    Platelets 308  150 - 400 K/uL    Neutrophils Relative 78 (*) 43 - 77 %    Neutro Abs 7.6  1.7 - 7.7 K/uL    Lymphocytes Relative 17  12 - 46 %    Lymphs Abs 1.7  0.7 - 4.0 K/uL    Monocytes Relative 3  3 - 12 %    Monocytes Absolute 0.3  0.1 - 1.0 K/uL    Eosinophils Relative 2  0 - 5 %    Eosinophils Absolute 0.2  0.0 - 0.7 K/uL    Basophils Relative 1  0 - 1 %    Basophils Absolute 0.1  0.0 - 0.1 K/uL   POCT I-STAT TROPONIN I     Status: Normal   Collection Time   07/27/12  2:16 PM      Component Value Range Comment   Troponin i, poc 0.00  0.00 - 0.08 ng/mL    Comment 3            MAGNESIUM     Status:  Normal   Collection Time   07/27/12  5:24 PM      Component Value Range Comment   Magnesium 2.2  1.5 - 2.5 mg/dL    Dg Chest 2 View  78/11/9560  *RADIOLOGY REPORT*  Clinical Data: Chest tightness.  Pre stress test.  CHEST - 2 VIEW  Comparison: None.  Findings: Trachea is midline.  Heart size normal.  Lungs are clear. No pleural fluid.  IMPRESSION: No acute findings.   Original Report Authenticated By: [name], M.D.     ROS: General:No recent colds or fevers, increased stress due to her work as well as her father's illness Skin:No rashes or ulcers HEENT:No blurred vision or double ZH:YQMVH pain as described  Recent LDL 110 PUL:no shortness of breath GI:No diarrhea constipation or melena GU:No hematuria GYN: menopausal QI:ONGEX aching on Mobic Neuro:No syncope or lightheadedness Endo:No diabetes or thyroid disease   Blood pressure 125/53, pulse 80, temperature 98.3 F (36.8 C), temperature source Oral, resp. rate 20, SpO2 97.00%. PE: General:Alert oriented white female, anxious, pleasant affect no acute distress but does have episodic chest pressure Skin:Warm and dry brisk capillary refill HEENT:Normocephalic, sclerae clear Neck:Supple no JVD no bruits Heart:S1-S2 regular rate and rhythm without murmur gallop rub or click Lungs:Clear without rales rhonchi or wheezes BMW:UXLKGMWN bowel sounds soft nontender do not palpate liver spleen or masses Ext:No edema 2+ pedal pulses bilaterally Neuro:Alert and oriented x3 follows commands moves all extremities    Assessment/Plan Principal Problem:  *Chest pain at rest, with strong family history of CAD. Active Problems:  Hyperlipidemia  PLAN: Admit to Telemetry,observation for pain control begin  nitro paste, IV Lopressor for hypertension. We'll plan cardiac CT in the morning. She is on heparin for the VTE prophylaxis.  Since we're holding her Mobic will give IV Toradol every 6 hours x3 to help with joint pain as well as chest  pain.  [name] 07/27/2012, 6:05 PM

## 2012-07-27 NOTE — Progress Notes (Signed)
Pt complaining of dull ache in chest. Nitroglycerin sublingual and morphine offered but pt refused. Pt states not enough to want pain medicine. Gave xanax prn for anxiety, will continue to monitor.

## 2012-07-27 NOTE — ED Notes (Signed)
States is due for stress test tom cp since this am tightness and her heart races and hot flashes and h/a

## 2012-07-27 NOTE — ED Notes (Signed)
Vernona Rieger with [facility] called st's they are going to admit pt for a cardiac cath tomorrow.  Charge Nurse Clyde Canterbury RN made aware.

## 2012-07-28 ENCOUNTER — Encounter (HOSPITAL_COMMUNITY): Payer: Self-pay | Admitting: Cardiology

## 2012-07-28 ENCOUNTER — Observation Stay (HOSPITAL_COMMUNITY): Payer: BC Managed Care – PPO

## 2012-07-28 DIAGNOSIS — I2510 Atherosclerotic heart disease of native coronary artery without angina pectoris: Secondary | ICD-10-CM | POA: Diagnosis present

## 2012-07-28 LAB — CBC
Platelets: 306 10*3/uL (ref 150–400)
RBC: 4.51 MIL/uL (ref 3.87–5.11)
RDW: 12.5 % (ref 11.5–15.5)
WBC: 10.3 10*3/uL (ref 4.0–10.5)

## 2012-07-28 LAB — COMPREHENSIVE METABOLIC PANEL
Alkaline Phosphatase: 74 U/L (ref 39–117)
BUN: 11 mg/dL (ref 6–23)
Chloride: 105 mEq/L (ref 96–112)
Creatinine, Ser: 0.9 mg/dL (ref 0.50–1.10)
GFR calc Af Amer: 89 mL/min — ABNORMAL LOW (ref >90)
Glucose, Bld: 89 mg/dL (ref 70–99)
Potassium: 4.1 mEq/L (ref 3.5–5.1)
Total Bilirubin: 1 mg/dL (ref 0.3–1.2)

## 2012-07-28 LAB — PROTIME-INR
INR: 0.99 (ref 0.00–1.49)
Prothrombin Time: 13 seconds (ref 11.6–15.2)

## 2012-07-28 LAB — TROPONIN I: Troponin I: <0.3 ng/mL (ref <0.30)

## 2012-07-28 MED ORDER — ASPIRIN 81 MG PO TBEC: 81.0000 mg | DELAYED_RELEASE_TABLET | Freq: Every day | ORAL | Status: DC

## 2012-07-28 MED ORDER — NEBIVOLOL HCL 5 MG PO TABS: 5.0000 mg | ORAL_TABLET | Freq: Every day | ORAL | Status: DC

## 2012-07-28 MED ORDER — ALPRAZOLAM 0.25 MG PO TABS: 0.2500 mg | ORAL_TABLET | Freq: Two times a day (BID) | ORAL | Status: DC | PRN

## 2012-07-28 MED ORDER — PANTOPRAZOLE SODIUM 40 MG PO TBEC: 40.0000 mg | DELAYED_RELEASE_TABLET | Freq: Every day | ORAL | Status: DC

## 2012-07-28 MED ORDER — ATORVASTATIN CALCIUM 40 MG PO TABS
40.0000 mg | ORAL_TABLET | Freq: Every day | ORAL | Status: DC
  Filled 2012-07-28: qty 1

## 2012-07-28 MED ORDER — NEBIVOLOL HCL 5 MG PO TABS
5.0000 mg | ORAL_TABLET | Freq: Every day | ORAL | Status: DC
  Filled 2012-07-28: qty 1

## 2012-07-28 MED ORDER — METOPROLOL TARTRATE 100 MG PO TABS
100.0000 mg | ORAL_TABLET | Freq: Once | ORAL | Status: AC
  Administered 2012-07-28: 100 mg via ORAL
  Filled 2012-07-28: qty 1

## 2012-07-28 MED ORDER — ZOLPIDEM TARTRATE 10 MG PO TABS: 10.0000 mg | ORAL_TABLET | Freq: Every evening | ORAL | Status: DC | PRN

## 2012-07-28 MED ORDER — METOPROLOL TARTRATE 1 MG/ML IV SOLN
INTRAVENOUS | Status: AC
  Administered 2012-07-28: 5 mg
  Filled 2012-07-28: qty 10

## 2012-07-28 MED ORDER — IOHEXOL 350 MG/ML SOLN
80.0000 mL | Freq: Once | INTRAVENOUS | Status: AC | PRN
  Administered 2012-07-28: 80 mL via INTRAVENOUS

## 2012-07-28 MED ORDER — ATORVASTATIN CALCIUM 40 MG PO TABS: 40.0000 mg | ORAL_TABLET | Freq: Every day | ORAL | Status: DC

## 2012-07-28 NOTE — Progress Notes (Signed)
Subjective:  No CP/SOB  Objective:  Temp:  [98.3 F (36.8 C)-99.1 F (37.3 C)] 98.8 F (37.1 C) (10/08 0518) Pulse Rate:  [68-85] 68  (10/08 0518) Resp:  [16-21] 19  (10/08 0518) BP: (104-159)/(53-99) 104/61 mmHg (10/08 0518) SpO2:  [97 %-100 %] 99 % (10/08 0518) Weight:  [88.4 kg (194 lb 14.2 oz)] 88.4 kg (194 lb 14.2 oz) (10/07 1939) Weight change:   Intake/Output from previous day: 10/07 0701 - 10/08 0700 In: 360 [P.O.:360] Out: -   Intake/Output from this shift:    Physical Exam: General appearance: alert, cooperative, appears stated age and no distress Neck: no adenopathy, no carotid bruit, no JVD, supple, symmetrical, trachea midline and thyroid not enlarged, symmetric, no tenderness/mass/nodules Lungs: clear to auscultation bilaterally Heart: regular rate and rhythm, S1, S2 normal, no murmur, click, rub or gallop Extremities: extremities normal, atraumatic, no cyanosis or edema  Lab Results: Results for orders placed during the hospital encounter of 07/27/12 (from the past 48 hour(s))  COMPREHENSIVE METABOLIC PANEL     Status: Abnormal   Collection Time   07/27/12  1:49 PM      Component Value Range Comment   Sodium 139  135 - 145 mEq/L    Potassium 4.3  3.5 - 5.1 mEq/L    Chloride 103  96 - 112 mEq/L    CO2 23  19 - 32 mEq/L    Glucose, Bld 89  70 - 99 mg/dL    BUN 8  6 - 23 mg/dL    Creatinine, Ser 0.98  0.50 - 1.10 mg/dL    Calcium 11.9  8.4 - 10.5 mg/dL    Total Protein 7.6  6.0 - 8.3 g/dL    Albumin 4.1  3.5 - 5.2 g/dL    AST 21  0 - 37 U/L    ALT 23  0 - 35 U/L    Alkaline Phosphatase 82  39 - 117 U/L    Total Bilirubin 0.6  0.3 - 1.2 mg/dL    GFR calc non Af Amer 85 (*) >90 mL/min    GFR calc Af Amer >90  >90 mL/min   CBC WITH DIFFERENTIAL     Status: Abnormal   Collection Time   07/27/12  1:49 PM      Component Value Range Comment   WBC 9.9  4.0 - 10.5 K/uL    RBC 4.60  3.87 - 5.11 MIL/uL    Hemoglobin 13.6  12.0 - 15.0 g/dL    HCT 14.7  82.9 -  56.2 %    MCV 88.5  78.0 - 100.0 fL    MCH 29.6  26.0 - 34.0 pg    MCHC 33.4  30.0 - 36.0 g/dL    RDW 13.0  86.5 - 78.4 %    Platelets 308  150 - 400 K/uL    Neutrophils Relative 78 (*) 43 - 77 %    Neutro Abs 7.6  1.7 - 7.7 K/uL    Lymphocytes Relative 17  12 - 46 %    Lymphs Abs 1.7  0.7 - 4.0 K/uL    Monocytes Relative 3  3 - 12 %    Monocytes Absolute 0.3  0.1 - 1.0 K/uL    Eosinophils Relative 2  0 - 5 %    Eosinophils Absolute 0.2  0.0 - 0.7 K/uL    Basophils Relative 1  0 - 1 %    Basophils Absolute 0.1  0.0 - 0.1 K/uL   POCT  I-STAT TROPONIN I     Status: Normal   Collection Time   07/27/12  2:16 PM      Component Value Range Comment   Troponin i, poc 0.00  0.00 - 0.08 ng/mL    Comment 3            CK TOTAL AND CKMB     Status: Normal   Collection Time   07/27/12  5:24 PM      Component Value Range Comment   Total CK 82  7 - 177 U/L    CK, MB 1.8  0.3 - 4.0 ng/mL    Relative Index RELATIVE INDEX IS INVALID  0.0 - 2.5   TROPONIN I     Status: Normal   Collection Time   07/27/12  5:24 PM      Component Value Range Comment   Troponin I <0.30  <0.30 ng/mL   MAGNESIUM     Status: Normal   Collection Time   07/27/12  5:24 PM      Component Value Range Comment   Magnesium 2.2  1.5 - 2.5 mg/dL   TSH     Status: Normal   Collection Time   07/27/12  5:24 PM      Component Value Range Comment   TSH 2.392  0.350 - 4.500 uIU/mL   CBC     Status: Abnormal   Collection Time   07/27/12  8:43 PM      Component Value Range Comment   WBC 13.9 (*) 4.0 - 10.5 K/uL    RBC 4.50  3.87 - 5.11 MIL/uL    Hemoglobin 13.6  12.0 - 15.0 g/dL    HCT 04.5  40.9 - 81.1 %    MCV 88.0  78.0 - 100.0 fL    MCH 30.2  26.0 - 34.0 pg    MCHC 34.3  30.0 - 36.0 g/dL    RDW 91.4  78.2 - 95.6 %    Platelets 297  150 - 400 K/uL   CREATININE, SERUM     Status: Abnormal   Collection Time   07/27/12  8:43 PM      Component Value Range Comment   Creatinine, Ser 0.91  0.50 - 1.10 mg/dL    GFR calc non Af  Amer 76 (*) >90 mL/min    GFR calc Af Amer 88 (*) >90 mL/min   TROPONIN I     Status: Normal   Collection Time   07/27/12 10:55 PM      Component Value Range Comment   Troponin I <0.30  <0.30 ng/mL   COMPREHENSIVE METABOLIC PANEL     Status: Abnormal   Collection Time   07/28/12  5:35 AM      Component Value Range Comment   Sodium 139  135 - 145 mEq/L    Potassium 4.1  3.5 - 5.1 mEq/L    Chloride 105  96 - 112 mEq/L    CO2 22  19 - 32 mEq/L    Glucose, Bld 89  70 - 99 mg/dL    BUN 11  6 - 23 mg/dL    Creatinine, Ser 2.13  0.50 - 1.10 mg/dL    Calcium 9.5  8.4 - 08.6 mg/dL    Total Protein 6.8  6.0 - 8.3 g/dL    Albumin 3.5  3.5 - 5.2 g/dL    AST 17  0 - 37 U/L    ALT 20  0 - 35 U/L  Alkaline Phosphatase 74  39 - 117 U/L    Total Bilirubin 1.0  0.3 - 1.2 mg/dL    GFR calc non Af Amer 77 (*) >90 mL/min    GFR calc Af Amer 89 (*) >90 mL/min   CBC     Status: Normal   Collection Time   07/28/12  5:35 AM      Component Value Range Comment   WBC 10.3  4.0 - 10.5 K/uL    RBC 4.51  3.87 - 5.11 MIL/uL    Hemoglobin 13.5  12.0 - 15.0 g/dL    HCT 40.9  81.1 - 91.4 %    MCV 88.2  78.0 - 100.0 fL    MCH 29.9  26.0 - 34.0 pg    MCHC 33.9  30.0 - 36.0 g/dL    RDW 78.2  95.6 - 21.3 %    Platelets 306  150 - 400 K/uL   PROTIME-INR     Status: Normal   Collection Time   07/28/12  5:35 AM      Component Value Range Comment   Prothrombin Time 13.0  11.6 - 15.2 seconds    INR 0.99  0.00 - 1.49   TROPONIN I     Status: Normal   Collection Time   07/28/12  5:35 AM      Component Value Range Comment   Troponin I <0.30  <0.30 ng/mL     Imaging: Imaging results have been reviewed  Assessment/Plan:   1. Principal Problem: 2.  *Chest pain at rest, with strong family history of CAD. 3. Active Problems: 4.  Hyperlipidemia 5.   Time Spent Directly with Patient:  20 minutes  Length of Stay:  LOS: 1 day   No further CP. Exam benign. Labs OK. Enz neg. EKG w/o acute changes. Plan  Coronary CTA today. If neg, D/C home. Discussed with pt and family.  [name] 07/28/2012, 9:01 AM

## 2012-07-28 NOTE — Discharge Summary (Signed)
Physician Discharge Summary  Patient ID: [name] MRN: [id number] DOB/AGE: 43-Jun-1972 43 y.o.  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 Over last 3-4 weeks she has had substernal chest discomfort. Day of admit it increased and she had mild nausea and some discomfort in rt. Arm. Mostly felt like a chest pressure/tightness, then she did have occ. Sharp pain. She also complained of tachycardia. With her family history she and Dr. Allyson Sabal were concerned about CAD, she had been scheduled for a Met Test in the AM.  In the ER without acute changes in EKG, initial cardiac troponin is negative, was seen by Dr. Rennis Golden and cardiac CT was ordered for the AM.  She was given Toradol overnight.  She also was given ambien, NTG paste, VTE prophylaxis with Heparin subcu and prn Xanax.  She rested well and by the next am cardiac enzymes were negative and she had no further pain.  She underwent cardiac CT.   Results:  (though please see full report) 1. Positive for nonobstructive coronary artery disease. The patient's total coronary artery calcium score is 130.4, which is 100th percentile for patient's matched age and gender. 2. No acute extracardiac abnormality. 3. Right coronary artery dominance.   Dr. Allyson Sabal reviewed the results with the pt and her mother together, along with a discussion of the importance of lifestyle changes.  He did warn her that her non obstructive disease could progress if changes were not made.     She ambulated without complications and was stable for discharge home.  Consults: None  Significant Diagnostic Studies:  BMET    Component Value  Date/Time   NA 139 07/28/2012 0535   K 4.1 07/28/2012 0535   CL 105 07/28/2012 0535   CO2 22 07/28/2012 0535   GLUCOSE 89 07/28/2012 0535   BUN 11 07/28/2012 0535   CREATININE 0.90 07/28/2012 0535   CALCIUM 9.5 07/28/2012 0535   GFRNONAA 77* 07/28/2012 0535   GFRAA 89* 07/28/2012 0535    CBC    Component Value Date/Time   WBC 10.3 07/28/2012 0535   RBC 4.51 07/28/2012 0535   HGB 13.5 07/28/2012 0535   HCT 39.8 07/28/2012 0535   PLT 306 07/28/2012 0535   MCV 88.2 07/28/2012 0535   MCH 29.9 07/28/2012 0535   MCHC 33.9 07/28/2012 0535   RDW 12.5 07/28/2012 0535   LYMPHSABS 1.7 07/27/2012 1349   MONOABS 0.3 07/27/2012 1349   EOSABS 0.2 07/27/2012 1349   BASOSABS 0.1 07/27/2012 1349    CARDIAC ENZYMES all normal  CHEST - 2 VIEW  Comparison: None.  Findings: Trachea is midline. Heart size normal. Lungs are clear.  No pleural fluid.  IMPRESSION:  No acute findings.   EKG: no acute changes   Discharge Exam: Blood pressure 132/84, pulse 60, temperature 98.8 F (37.1 C), temperature source Oral, resp. rate 18, height 5\' 2"  (1.575 m), weight 88.4 kg (194 lb 14.2 oz), SpO2 100.00%.   General appearance: alert, cooperative, appears stated age and no distress  Neck: no adenopathy, no carotid bruit, no JVD, supple, symmetrical, trachea midline and thyroid not enlarged, symmetric, no tenderness/mass/nodules  Lungs: clear to auscultation bilaterally  Heart: regular rate and rhythm,  S1, S2 normal, no murmur, click, rub or gallop  Extremities: extremities normal, atraumatic, no cyanosis or edema     Disposition: Home     Medication List     As of 07/28/2012  3:51 PM    STOP taking these medications         OVER THE COUNTER MEDICATION      TAKE these medications         ALPRAZolam 0.25 MG tablet   Commonly known as: XANAX   Take 1 tablet (0.25 mg total) by mouth 2 (two) times daily as needed for anxiety.      aspirin 81 MG EC tablet   Take 1 tablet (81 mg total) by mouth daily.       atorvastatin 40 MG tablet   Commonly known as: LIPITOR   Take 1 tablet (40 mg total) by mouth daily at 6 PM.      docusate sodium 100 MG capsule   Commonly known as: COLACE   Take 100 mg by mouth 2 (two) times daily.      meloxicam 15 MG tablet   Commonly known as: MOBIC   Take 15 mg by mouth daily.      nebivolol 5 MG tablet   Commonly known as: BYSTOLIC   Take 1 tablet (5 mg total) by mouth daily.      NUVARING 0.12-0.015 MG/24HR vaginal ring   Generic drug: etonogestrel-ethinyl estradiol   Insert vaginally and leave in place for 3 consecutive weeks, then remove for 1 week.      pantoprazole 40 MG tablet   Commonly known as: PROTONIX   Take 1 tablet (40 mg total) by mouth daily at 12 noon.      zolpidem 10 MG tablet   Commonly known as: AMBIEN   Take 1 tablet (10 mg total) by mouth at bedtime as needed for sleep.           Follow-up Information    Follow up with Runell Gess, MD. On 10/08/2012. (at 9:00 am)    Contact information:   [address] [place] [address] [phone]         DISCHARGE INSTRUCTIONS: Heart Healthy diet  Begin exercise, start slow and work up  You have beginnings of Coronary artery disease, with diet and exercise you can help prevent it from increasing.    Your Lipitor should be at 40 mg daily  Signed: Braidan Ricciardi R 07/28/2012, 3:51 PM

## 2012-07-28 NOTE — Progress Notes (Signed)
Pt ambulated 550 feet with mom at side; no complaints of cp or SOB; pt to d/c home.

## 2012-07-28 NOTE — Care Management Note (Signed)
    Page 1 of 1   07/28/2012     3:21:58 PM   CARE MANAGEMENT NOTE 07/28/2012  Patient:  [name],[name]   Account Number:  [id number]  Date Initiated:  07/28/2012  Documentation initiated by:  Makenzye Troutman  Subjective/Objective Assessment:   PT ADM WITH CHEST PAIN ON 07/27/12.  PTA, PT INDEPENDENT, LIVES AT HOME WITH HER SON.     Action/Plan:   MET WITH PT AND HER MOTHER, AT BEDSIDE.  GAVE PT INFO ON [facility], AS PT CURRENTLY HAS NO PCP.  SHE IS APPRECIATIVE OF INFO.   Anticipated DC Date:  07/28/2012   Anticipated DC Plan:  HOME/SELF CARE      DC Planning Services  CM consult  PCP issues      Choice offered to / List presented to:             Status of service:  Completed, signed off Medicare Important Message given?   (If response is "NO", the following Medicare IM given date fields will be blank) Date Medicare IM given:   Date Additional Medicare IM given:    Discharge Disposition:  HOME/SELF CARE  Per UR Regulation:  Reviewed for med. necessity/level of care/duration of stay  If discussed at Long Length of Stay Meetings, dates discussed:    Comments:  WILL FOLLOW FOR HOME NEEDS AS PT PROGRESSES.  Pervis Hocking [phone]

## 2012-07-28 NOTE — Progress Notes (Signed)
Spoke with Dr. Allyson Sabal; pt able to eat at this time; will cont. To monitor.

## 2012-07-28 NOTE — Progress Notes (Signed)
Pt currently NPO; pt back from cardiac CT; pt inquiring about when she will be able to eat; MD paged at this time; will await callback.

## 2012-08-04 ENCOUNTER — Other Ambulatory Visit: Payer: Self-pay | Admitting: Obstetrics and Gynecology

## 2012-08-04 DIAGNOSIS — Z1231 Encounter for screening mammogram for malignant neoplasm of breast: Secondary | ICD-10-CM

## 2012-08-06 ENCOUNTER — Telehealth: Payer: Self-pay | Admitting: Obstetrics and Gynecology

## 2012-08-06 NOTE — Telephone Encounter (Signed)
[facility] pt 

## 2012-08-06 NOTE — Telephone Encounter (Signed)
Pc to pharm per telephone call. Spoke with [name]. Rx was written correctly on 06/18/12(see pharm comments from original rx);however pharm was giving pt rx written for Nuvaring from 01/2012. Pt will be eligible to receive a new ring on 08/08/12. Pt voices understanding.

## 2012-09-03 ENCOUNTER — Other Ambulatory Visit (HOSPITAL_COMMUNITY): Payer: Self-pay

## 2012-09-03 DIAGNOSIS — R079 Chest pain, unspecified: Secondary | ICD-10-CM

## 2012-09-04 ENCOUNTER — Ambulatory Visit
Admission: RE | Admit: 2012-09-04 | Discharge: 2012-09-04 | Disposition: A | Discharge status: Home / Self Care | Payer: BC Managed Care – PPO | Source: Ambulatory Visit | Attending: Obstetrics and Gynecology | Admitting: Obstetrics and Gynecology

## 2012-09-04 DIAGNOSIS — Z1231 Encounter for screening mammogram for malignant neoplasm of breast: Secondary | ICD-10-CM

## 2012-09-25 ENCOUNTER — Encounter (HOSPITAL_COMMUNITY): Payer: BC Managed Care – PPO

## 2012-10-01 ENCOUNTER — Ambulatory Visit (HOSPITAL_COMMUNITY)
Admission: RE | Admit: 2012-10-01 | Discharge: 2012-10-01 | Disposition: A | Discharge status: Home / Self Care | Payer: BC Managed Care – PPO | Source: Ambulatory Visit | Attending: Cardiovascular Disease | Admitting: Cardiovascular Disease

## 2012-10-01 DIAGNOSIS — R0989 Other specified symptoms and signs involving the circulatory and respiratory systems: Secondary | ICD-10-CM | POA: Insufficient documentation

## 2012-10-01 DIAGNOSIS — E785 Hyperlipidemia, unspecified: Secondary | ICD-10-CM | POA: Insufficient documentation

## 2012-10-01 DIAGNOSIS — R0609 Other forms of dyspnea: Secondary | ICD-10-CM | POA: Insufficient documentation

## 2012-10-01 DIAGNOSIS — R079 Chest pain, unspecified: Secondary | ICD-10-CM | POA: Insufficient documentation

## 2012-10-01 DIAGNOSIS — E669 Obesity, unspecified: Secondary | ICD-10-CM | POA: Insufficient documentation

## 2012-10-01 DIAGNOSIS — Z8249 Family history of ischemic heart disease and other diseases of the circulatory system: Secondary | ICD-10-CM | POA: Insufficient documentation

## 2012-10-01 DIAGNOSIS — R5381 Other malaise: Secondary | ICD-10-CM | POA: Insufficient documentation

## 2012-10-01 MED ORDER — TECHNETIUM TC 99M SESTAMIBI GENERIC - CARDIOLITE
31.6000 | Freq: Once | INTRAVENOUS | Status: AC | PRN
  Administered 2012-10-01: 32 via INTRAVENOUS

## 2012-10-01 MED ORDER — TECHNETIUM TC 99M SESTAMIBI GENERIC - CARDIOLITE
10.4000 | Freq: Once | INTRAVENOUS | Status: AC | PRN
  Administered 2012-10-01: 10 via INTRAVENOUS

## 2012-10-01 NOTE — Procedures (Addendum)
[name] [address] [place] [address] [place] [address] [phone]  Cardiology Nuclear Med Study  [name] is a 41 y.o. female     MRN : [id number]     DOB: 22-Apr-1971  Procedure Date: 10/01/2012  Nuclear Med Background Indication for Stress Test:  Evaluation for Ischemia History:  No Prior Cardiac History Cardiac Risk Factors: Family History - CAD, Lipids and Obesity  Symptoms:  Chest Pain, DOE, Fatigue and SOB   Nuclear Pre-Procedure Caffeine/Decaff Intake:  None NPO After: 5:00am   IV Site: R Antecubital  IV 0.9% NS with Angio Cath:  22g  Chest Size (in):  38 IV Started by: Bonnita Levan, RN  Height: 5\' 3"  (1.6 m)  Cup Size: DD  BMI:  Body mass index is 34.90 kg/(m^2). Weight:  197 lb (89.359 kg)   Tech Comments:  Bystolic held x 36 hrs    Nuclear Med Study 1 or 2 day study: 1 day  Stress Test Type:  Stress  Order Authorizing Provider:  Nanetta Batty, MD   Resting Radionuclide: Technetium 1m Sestamibi  Resting Radionuclide Dose: 10.4 mCi   Stress Radionuclide:  Technetium 76m Sestamibi  Stress Radionuclide Dose: 31.6 mCi           Stress Protocol Rest HR: 74 Stress HR: 166  Rest BP: 124/85 Stress BP: 175/88  Exercise Time (min): 11:30 METS: 12.7   Predicted Max HR: 177 bpm % Max HR: 93.79 bpm Rate Pressure Product: 21308   Dose of Adenosine (mg):  n/a Dose of Lexiscan: n/a mg  Dose of Atropine (mg): n/a Dose of Dobutamine: n/a mcg/kg/min (at max HR)  Stress Test Technologist: Esperanza Sheets, CCT Nuclear Technologist: Gonzella Lex, CNMT   Rest Procedure:  Myocardial perfusion imaging was performed at rest 45 minutes following the intravenous administration of Technetium 26m Sestamibi. Stress Procedure:  The patient performed treadmill exercise using a Bruce  Protocol for 11:30 minutes. The patient stopped due to SOB and General Fatigue and denied any chest pain.  There were no significant ST-T wave  changes.  Technetium 22m Sestamibi was injected at peak exercise and myocardial perfusion imaging was performed after a brief delay.  Transient Ischemic Dilatation (Normal <1.22):  0.99 Lung/Heart Ratio (Normal <0.45):  0.30 QGS EDV:  79 ml QGS ESV:  29 ml LV Ejection Fraction: 63%       Rest ECG: NSR - Normal EKG  Stress ECG: No significant change from baseline ECG  QPS Raw Data Images:  Normal; no motion artifact; normal heart/lung ratio. Stress Images:  Normal homogeneous uptake in all areas of the myocardium. Rest Images:  Normal homogeneous uptake in all areas of the myocardium. Subtraction (SDS):  No evidence of ischemia.  Impression Exercise Capacity:  Excellent exercise capacity. BP Response:  Normal blood pressure response. Clinical Symptoms:  No significant symptoms noted. ECG Impression:  No significant ST segment change suggestive of ischemia. Comparison with Prior Nuclear Study: No images to compare  Overall Impression:  Normal stress nuclear study.  LV Wall Motion:  NL LV Function; NL Wall Motion   Runell Gess, MD  10/01/2012 5:40 PM

## 2012-10-09 ENCOUNTER — Encounter (HOSPITAL_COMMUNITY): Payer: BC Managed Care – PPO

## 2013-02-11 ENCOUNTER — Encounter: Payer: Self-pay | Admitting: Cardiovascular Disease

## 2013-03-04 ENCOUNTER — Telehealth: Payer: Self-pay | Admitting: Cardiovascular Disease

## 2013-03-04 NOTE — Telephone Encounter (Signed)
Would like to switch to Atenolol because it less expensive-Dr Allyson Sabal said it would be all right-Please call to [facility] [phone]!

## 2013-03-05 MED ORDER — ATENOLOL 25 MG PO TABS: ORAL_TABLET | ORAL | Status: DC

## 2013-03-05 NOTE — Telephone Encounter (Signed)
Left message message received for medication change request. New RX will be sent to pharmacy./WW

## 2013-08-04 ENCOUNTER — Other Ambulatory Visit: Payer: Self-pay | Admitting: *Deleted

## 2013-08-04 MED ORDER — ATORVASTATIN CALCIUM 40 MG PO TABS: 40.0000 mg | ORAL_TABLET | Freq: Every day | ORAL | Status: DC

## 2013-08-04 NOTE — Telephone Encounter (Signed)
Rx was sent to pharmacy electronically. 

## 2013-09-09 ENCOUNTER — Other Ambulatory Visit: Payer: Self-pay

## 2013-09-09 DIAGNOSIS — Z9889 Other specified postprocedural states: Secondary | ICD-10-CM

## 2013-09-09 DIAGNOSIS — Z1231 Encounter for screening mammogram for malignant neoplasm of breast: Secondary | ICD-10-CM

## 2013-09-17 ENCOUNTER — Ambulatory Visit
Admission: RE | Admit: 2013-09-17 | Discharge: 2013-09-17 | Disposition: A | Discharge status: Home / Self Care | Payer: BC Managed Care – PPO | Source: Ambulatory Visit

## 2013-09-17 DIAGNOSIS — Z9889 Other specified postprocedural states: Secondary | ICD-10-CM

## 2013-09-17 DIAGNOSIS — Z1231 Encounter for screening mammogram for malignant neoplasm of breast: Secondary | ICD-10-CM

## 2013-09-29 ENCOUNTER — Telehealth: Payer: Self-pay | Admitting: Cardiovascular Disease

## 2013-09-29 DIAGNOSIS — E782 Mixed hyperlipidemia: Secondary | ICD-10-CM

## 2013-09-29 DIAGNOSIS — Z79899 Other long term (current) drug therapy: Secondary | ICD-10-CM

## 2013-09-29 NOTE — Telephone Encounter (Signed)
Would like to know if she needs lab work before her appt on 10/18/13 at 11:45 if so please send order to [facility].Please call if she needs to have lab work done .Marland Kitchen    Thanks

## 2013-09-29 NOTE — Telephone Encounter (Signed)
Requesting lab order be sent to [facility] lab on the 1st floor if she needs labs done prior to visit.  It has been a year since last labs done.  Order sent to [facility].

## 2013-09-29 NOTE — Telephone Encounter (Signed)
Order sent to [facility] 1st floor. Patient notified.

## 2013-09-30 LAB — LIPID PANEL
HDL: 44 mg/dL (ref >39)
LDL Cholesterol: 79 mg/dL (ref 0–99)
Total CHOL/HDL Ratio: 3.7 Ratio
VLDL: 41 mg/dL — ABNORMAL HIGH (ref 0–40)

## 2013-09-30 LAB — COMPREHENSIVE METABOLIC PANEL
ALT: 30 U/L (ref 0–35)
AST: 24 U/L (ref 0–37)
Alkaline Phosphatase: 88 U/L (ref 39–117)
Glucose, Bld: 107 mg/dL — ABNORMAL HIGH (ref 70–99)
Sodium: 139 mEq/L (ref 135–145)
Total Bilirubin: 0.8 mg/dL (ref 0.3–1.2)
Total Protein: 6.9 g/dL (ref 6.0–8.3)

## 2013-10-08 ENCOUNTER — Encounter: Payer: Self-pay | Admitting: *Deleted

## 2013-10-11 ENCOUNTER — Ambulatory Visit (INDEPENDENT_AMBULATORY_CARE_PROVIDER_SITE_OTHER): Payer: BC Managed Care – PPO | Admitting: Podiatry

## 2013-10-11 ENCOUNTER — Encounter: Payer: Self-pay | Admitting: Podiatry

## 2013-10-11 VITALS — BP 136/78 | HR 73 | Resp 16

## 2013-10-11 DIAGNOSIS — M722 Plantar fascial fibromatosis: Secondary | ICD-10-CM

## 2013-10-11 DIAGNOSIS — M779 Enthesopathy, unspecified: Secondary | ICD-10-CM

## 2013-10-11 MED ORDER — TRIAMCINOLONE ACETONIDE 10 MG/ML IJ SUSP
10.0000 mg | Freq: Once | INTRAMUSCULAR | Status: AC
  Administered 2013-10-11: 10 mg

## 2013-10-11 MED ORDER — MELOXICAM 15 MG PO TABS: 15.0000 mg | ORAL_TABLET | Freq: Every day | ORAL | Status: DC

## 2013-10-11 NOTE — Progress Notes (Signed)
Subjective:     Patient ID: Toy Baker, female   DOB: 1971/10/15, 42 y.o.   MRN: [id number]  HPI patient points to the bottom of the left heel states it is tender again and also to the right second metatarsal joint stating that has started to become tender recently   Review of Systems     Objective:   Physical Exam Neurovascular status intact with no other health history changes noted. Pain to palpation second MPJ right and plantar heel with pressure    Assessment:     Capsulitis second MPJ right and plantar fasciitis left    Plan:     Recommended we go after both problems and today I did a proximal nerve block right aspirated the joint and was able to get up a small amount of clear fluid and injected with half cc of dexamethasone Kenalog combination. For the left I injected the left plantar fascia 3 mg Kenalog 5 mg Xylocaine Marcaine mixture and advised her reduced activity

## 2013-10-11 NOTE — Patient Instructions (Signed)

## 2013-10-12 ENCOUNTER — Ambulatory Visit: Payer: BC Managed Care – PPO

## 2013-10-18 ENCOUNTER — Encounter: Payer: Self-pay | Admitting: Cardiovascular Disease

## 2013-10-18 ENCOUNTER — Ambulatory Visit (INDEPENDENT_AMBULATORY_CARE_PROVIDER_SITE_OTHER): Payer: BC Managed Care – PPO | Admitting: Cardiovascular Disease

## 2013-10-18 VITALS — BP 130/77 | HR 85 | Ht 63.0 in | Wt 220.0 lb

## 2013-10-18 DIAGNOSIS — I10 Essential (primary) hypertension: Secondary | ICD-10-CM

## 2013-10-18 DIAGNOSIS — E785 Hyperlipidemia, unspecified: Secondary | ICD-10-CM

## 2013-10-18 DIAGNOSIS — I2510 Atherosclerotic heart disease of native coronary artery without angina pectoris: Secondary | ICD-10-CM

## 2013-10-18 NOTE — Assessment & Plan Note (Signed)
She had a CT in TRAM of her heart performed 07/28/12 showed minimal CAD. Her calcium score was 130 however. She denies chest pain.

## 2013-10-18 NOTE — Assessment & Plan Note (Signed)
On statin therapy with recent lipid profile performed 09/29/13 revealing a total cholesterol 164, LDL of 79 and HDL of 44

## 2013-10-18 NOTE — Assessment & Plan Note (Signed)
Controlled on current medications 

## 2013-10-18 NOTE — Patient Instructions (Signed)
Your physician wants you to follow-up in: 1 year with Dr Berry. You will receive a reminder letter in the mail two months in advance. If you don't receive a letter, please call our office to schedule the follow-up appointment.  

## 2013-10-18 NOTE — Progress Notes (Signed)
10/18/2013 [name]   [birth year]  [id number]  Primary Physician Gaye Alken, MD Primary Cardiologist: Runell Gess MD [name]   HPI:  The patient returns today for followup. She is a 42 year old, moderately overweight, divorced Caucasian female, mother of 1 whose parents are patients of mine as well. She has a history of hypertension, hyperlipidemia and positive family history for heart disease. She had a CT angiogram at [facility] which revealed mild to moderate CAD with a calcium score of 130. She does complain of chest pain and shortness of breath periodically. She had a Myoview stress test performed in our office October 02, 2012, which was entirely normal. Since I saw her back year ago she's been entirely asymptomatic other than the fact that she's gained over 20 pounds related to inactivity. She is usually higher dietitian to address this.    Current Outpatient Prescriptions  Medication Sig Dispense Refill  . aspirin EC 81 MG EC tablet Take 1 tablet (81 mg total) by mouth daily.      Marland Kitchen atenolol (TENORMIN) 25 MG tablet Take 1/2 tablet daily  45 tablet  3  . atorvastatin (LIPITOR) 40 MG tablet Take 1 tablet (40 mg total) by mouth daily at 6 PM.  30 tablet  2  . BIOTIN 5000 PO Take 1 tablet by mouth daily.      . Calcium Carb-Cholecalciferol (CALCIUM 1000 + D PO) Take 1 tablet by mouth daily.      Marland Kitchen docusate sodium (COLACE) 100 MG capsule Take 100 mg by mouth 2 (two) times daily.      Marland Kitchen estradiol (VIVELLE-DOT) 0.05 MG/24HR patch Place 1 patch onto the skin. Twice a week      . etonogestrel-ethinyl estradiol (NUVARING) 0.12-0.015 MG/24HR vaginal ring Place 1 each vaginally every 28 (twenty-eight) days. Insert vaginally and leave in place for 3 consecutive weeks, then remove for 1 week.      . meloxicam (MOBIC) 15 MG tablet Take 15 mg by mouth as needed.      . Multiple Vitamin (MULTIVITAMIN) capsule Take 1 capsule by mouth daily.      Marland Kitchen omeprazole  (PRILOSEC) 20 MG capsule Take 20 mg by mouth daily.      . sertraline (ZOLOFT) 50 MG tablet Take 50 mg by mouth daily.       No current facility-administered medications for this visit.    No Known Allergies  History   Social History  . Marital Status: Divorced    Spouse Name: N/A    Number of Children: N/A  . Years of Education: N/A   Occupational History  . Not on file.   Social History Main Topics  . Smoking status: Never Smoker   . Smokeless tobacco: Never Used  . Alcohol Use: No  . Drug Use: No  . Sexual Activity: Yes    Birth Control/ Protection: Pill, Inserts   Other Topics Concern  . Not on file   Social History Narrative  . No narrative on file     Review of Systems: General: negative for chills, fever, night sweats or weight changes.  Cardiovascular: negative for chest pain, dyspnea on exertion, edema, orthopnea, palpitations, paroxysmal nocturnal dyspnea or shortness of breath Dermatological: negative for rash Respiratory: negative for cough or wheezing Urologic: negative for hematuria Abdominal: negative for nausea, vomiting, diarrhea, bright red blood per rectum, melena, or hematemesis Neurologic: negative for visual changes, syncope, or dizziness All other systems reviewed and are otherwise negative except as noted  above.    Blood pressure 130/77, pulse 85, height 5\' 3"  (1.6 m), weight 220 lb (99.791 kg).  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 Her calcium score was 130 however. She denies chest pain.  Essential hypertension Controlled on current medications  Hyperlipidemia On statin therapy with recent lipid profile performed 09/29/13 revealing a total cholesterol 164, LDL of 79 and HDL of 44      Runell Gess MD [place], [facility] 10/18/2013 12:32 PM

## 2013-11-07 ENCOUNTER — Other Ambulatory Visit: Payer: Self-pay | Admitting: Cardiovascular Disease

## 2013-11-08 ENCOUNTER — Other Ambulatory Visit: Payer: Self-pay | Admitting: *Deleted

## 2013-11-08 NOTE — Telephone Encounter (Signed)
Rx was sent to pharmacy electronically. 

## 2014-01-13 ENCOUNTER — Other Ambulatory Visit: Payer: Self-pay | Admitting: Cardiovascular Disease

## 2014-01-13 NOTE — Telephone Encounter (Signed)
Rx was sent to pharmacy electronically. 

## 2014-03-16 ENCOUNTER — Telehealth: Payer: Self-pay | Admitting: *Deleted

## 2014-03-16 NOTE — Telephone Encounter (Signed)
Refill request for Meloxicam 15mg , quantity of 30.  Dr. Josephina Shih the refill plus 3 additional refills.

## 2014-06-27 ENCOUNTER — Encounter (HOSPITAL_COMMUNITY): Payer: Self-pay | Admitting: Emergency Medicine

## 2014-06-27 ENCOUNTER — Emergency Department (HOSPITAL_COMMUNITY): Payer: BC Managed Care – PPO

## 2014-06-27 ENCOUNTER — Emergency Department (HOSPITAL_COMMUNITY)
Admission: EM | Admit: 2014-06-27 | Discharge: 2014-06-28 | Disposition: A | Discharge status: Home / Self Care | Payer: BC Managed Care – PPO | Attending: Emergency Medicine | Admitting: Emergency Medicine

## 2014-06-27 DIAGNOSIS — E7800 Pure hypercholesterolemia, unspecified: Secondary | ICD-10-CM | POA: Insufficient documentation

## 2014-06-27 DIAGNOSIS — Z9889 Other specified postprocedural states: Secondary | ICD-10-CM | POA: Diagnosis not present

## 2014-06-27 DIAGNOSIS — R109 Unspecified abdominal pain: Secondary | ICD-10-CM | POA: Diagnosis present

## 2014-06-27 DIAGNOSIS — I10 Essential (primary) hypertension: Secondary | ICD-10-CM | POA: Diagnosis not present

## 2014-06-27 DIAGNOSIS — N898 Other specified noninflammatory disorders of vagina: Secondary | ICD-10-CM | POA: Insufficient documentation

## 2014-06-27 DIAGNOSIS — Z9089 Acquired absence of other organs: Secondary | ICD-10-CM | POA: Diagnosis not present

## 2014-06-27 DIAGNOSIS — E785 Hyperlipidemia, unspecified: Secondary | ICD-10-CM | POA: Diagnosis not present

## 2014-06-27 DIAGNOSIS — N949 Unspecified condition associated with female genital organs and menstrual cycle: Secondary | ICD-10-CM | POA: Diagnosis not present

## 2014-06-27 DIAGNOSIS — I2510 Atherosclerotic heart disease of native coronary artery without angina pectoris: Secondary | ICD-10-CM | POA: Insufficient documentation

## 2014-06-27 DIAGNOSIS — R1032 Left lower quadrant pain: Secondary | ICD-10-CM | POA: Insufficient documentation

## 2014-06-27 DIAGNOSIS — Z79899 Other long term (current) drug therapy: Secondary | ICD-10-CM | POA: Insufficient documentation

## 2014-06-27 DIAGNOSIS — Z7982 Long term (current) use of aspirin: Secondary | ICD-10-CM | POA: Insufficient documentation

## 2014-06-27 DIAGNOSIS — Z3202 Encounter for pregnancy test, result negative: Secondary | ICD-10-CM | POA: Diagnosis not present

## 2014-06-27 LAB — COMPREHENSIVE METABOLIC PANEL
ALBUMIN: 3.5 g/dL (ref 3.5–5.2)
ALK PHOS: 90 U/L (ref 39–117)
ALT: 20 U/L (ref 0–35)
AST: 20 U/L (ref 0–37)
Anion gap: 17 — ABNORMAL HIGH (ref 5–15)
BILIRUBIN TOTAL: 0.4 mg/dL (ref 0.3–1.2)
BUN: 16 mg/dL (ref 6–23)
CHLORIDE: 104 meq/L (ref 96–112)
CO2: 19 mEq/L (ref 19–32)
Calcium: 9.2 mg/dL (ref 8.4–10.5)
Creatinine, Ser: 0.77 mg/dL (ref 0.50–1.10)
GFR calc Af Amer: >90 mL/min (ref >90)
GFR calc non Af Amer: >90 mL/min (ref >90)
Glucose, Bld: 139 mg/dL — ABNORMAL HIGH (ref 70–99)
POTASSIUM: 4.2 meq/L (ref 3.7–5.3)
SODIUM: 140 meq/L (ref 137–147)
Total Protein: 7.1 g/dL (ref 6.0–8.3)

## 2014-06-27 LAB — CBC WITH DIFFERENTIAL/PLATELET
BASOS ABS: 0 10*3/uL (ref 0.0–0.1)
Basophils Relative: 0 % (ref 0–1)
Eosinophils Absolute: 0.5 10*3/uL (ref 0.0–0.7)
Eosinophils Relative: 4 % (ref 0–5)
HCT: 41.9 % (ref 36.0–46.0)
HEMOGLOBIN: 14.2 g/dL (ref 12.0–15.0)
Lymphocytes Relative: 19 % (ref 12–46)
Lymphs Abs: 2.5 10*3/uL (ref 0.7–4.0)
MCH: 30.2 pg (ref 26.0–34.0)
MCHC: 33.9 g/dL (ref 30.0–36.0)
MCV: 89.1 fL (ref 78.0–100.0)
MONOS PCT: 4 % (ref 3–12)
Monocytes Absolute: 0.5 10*3/uL (ref 0.1–1.0)
NEUTROS PCT: 73 % (ref 43–77)
Neutro Abs: 9.3 10*3/uL — ABNORMAL HIGH (ref 1.7–7.7)
Platelets: 346 10*3/uL (ref 150–400)
RBC: 4.7 MIL/uL (ref 3.87–5.11)
RDW: 12.8 % (ref 11.5–15.5)
WBC: 12.8 10*3/uL — ABNORMAL HIGH (ref 4.0–10.5)

## 2014-06-27 LAB — URINALYSIS, ROUTINE W REFLEX MICROSCOPIC
BILIRUBIN URINE: NEGATIVE
Glucose, UA: NEGATIVE mg/dL
KETONES UR: NEGATIVE mg/dL
Nitrite: NEGATIVE
Protein, ur: NEGATIVE mg/dL
Specific Gravity, Urine: 1.025 (ref 1.005–1.030)
Urobilinogen, UA: 0.2 mg/dL (ref 0.0–1.0)
pH: 6 (ref 5.0–8.0)

## 2014-06-27 LAB — URINE MICROSCOPIC-ADD ON

## 2014-06-27 LAB — WET PREP, GENITAL
Trich, Wet Prep: NONE SEEN
Yeast Wet Prep HPF POC: NONE SEEN

## 2014-06-27 LAB — POC URINE PREG, ED: Preg Test, Ur: NEGATIVE

## 2014-06-27 MED ORDER — SODIUM CHLORIDE 0.9 % IV BOLUS (SEPSIS)
1000.0000 mL | Freq: Once | INTRAVENOUS | Status: AC
  Administered 2014-06-27: 1000 mL via INTRAVENOUS

## 2014-06-27 MED ORDER — IOHEXOL 300 MG/ML  SOLN
25.0000 mL | Freq: Once | INTRAMUSCULAR | Status: AC | PRN
  Administered 2014-06-27: 25 mL via ORAL

## 2014-06-27 NOTE — ED Notes (Signed)
Patient still off the unit for testing 

## 2014-06-27 NOTE — ED Notes (Signed)
Patient is drinking contrast. Ambulated to restroom.

## 2014-06-27 NOTE — ED Notes (Signed)
Patient concerned about pain when standing versus sitting and wants to report to provider.  Called [place], PA-C. Pelvic exam already preformed.

## 2014-06-27 NOTE — ED Notes (Signed)
Per pt sts left lower pelvic pain. sts hurts when she moves and not when sitting still. sts started this am. sts hx of ovarian cyst and feels the same.

## 2014-06-27 NOTE — ED Provider Notes (Signed)
CSN: [id number]     Arrival date & time 06/27/14  1623 History   First MD Initiated Contact with Patient 06/27/14 2019     Chief Complaint  Patient presents with  . Abdominal Pain     (Consider location/radiation/quality/duration/timing/severity/associated sxs/prior Treatment) The history is provided by the patient and medical records. No language interpreter was used.    Darnetta Kesselman is a 43 y.o. female  with a hx of ovarian cyst, CAD, HTN, ventral hernia (repaired 2008) presents to the Emergency Department complaining of gradual, persistent, progressively worsening LLQ/pelvic pain onset this morning around 9:30am. Pt is described as sharp and stabbing.  No associated symptoms.  No treatments PTA.  Laying flat makes it better.  Standing and walking makes it worse.  Pt denies fever, chills, headache, neck pain, chest pain, SOB, N/V/D, weakness, dizziness, syncope.  Pt reports using the Nuva ring for [facility] and for dx of low estrogen, changing it every 3 weeks, thus is without menses.     Past Medical History  Diagnosis Date  . Abnormal Pap smear   . High cholesterol   . Chest pain at rest, with strong family history of CAD. 07/27/2012  . Hyperlipidemia 07/27/2012  . 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 Hypertension    Past Surgical History  Procedure Laterality Date  . Cholecystectomy      2003  . Hernia repair      2008  . Foot surgery      2004  . Carpal tunnel release      2001  . Cosmetic surgery      2009  . Cardiovascular stress test  10/01/2012    No significant ST segment change suggestive of ischemia.  . Transthoracic echocardiogram  04/24/2010    EF >55%, normal LV systolic function   Family History  Problem Relation Age of Onset  . Diabetes Paternal Grandfather   . Breast cancer Paternal Grandmother   . Breast cancer Maternal Grandmother   . Diabetes Father   . Coronary artery disease Father   . Coronary artery disease Mother     History  Substance Use Topics  . Smoking status: Never Smoker   . Smokeless tobacco: Never Used  . Alcohol Use: No   OB History   Grav Para Term Preterm Abortions TAB SAB Ect Mult Living   1 1 1  0 0 0 0 0 0 1     Review of Systems  Constitutional: Negative for fever, diaphoresis, appetite change, fatigue and unexpected weight change.  HENT: Negative for mouth sores and trouble swallowing.   Respiratory: Negative for cough, chest tightness, shortness of breath, wheezing and stridor.   Cardiovascular: Negative for chest pain and palpitations.  Gastrointestinal: Positive for abdominal pain. Negative for nausea, vomiting, diarrhea, constipation, blood in stool, abdominal distention and rectal pain.  Genitourinary: Negative for dysuria, urgency, frequency, hematuria, flank pain and difficulty urinating.  Musculoskeletal: Negative for back pain, neck pain and neck stiffness.  Skin: Negative for rash.  Neurological: Negative for weakness.  Hematological: Negative for adenopathy.  Psychiatric/Behavioral: Negative for confusion.  All other systems reviewed and are negative.     Allergies  Review of patient's allergies indicates no known allergies.  Home Medications   Prior to Admission medications   Medication Sig Start Date End Date Taking? Authorizing Provider  aspirin 81 MG EC tablet Take 81 mg by mouth daily. 07/28/12  Yes Cecilie Kicks, NP  atenolol (TENORMIN) 25 MG tablet  Take 12.5 mg by mouth at bedtime.   Yes Historical Provider, MD  atorvastatin (LIPITOR) 40 MG tablet Take 40 mg by mouth daily.   Yes Historical Provider, MD  BIOTIN 5000 PO Take 1 tablet by mouth daily.   Yes Historical Provider, MD  Calcium Carb-Cholecalciferol (CALCIUM 1000 + D PO) Take 1 tablet by mouth daily.   Yes Historical Provider, MD  docusate sodium (COLACE) 100 MG capsule Take 100 mg by mouth 2 (two) times daily.   Yes Historical Provider, MD  estradiol (VIVELLE-DOT) 0.05 MG/24HR patch Place 1  patch onto the skin 2 (two) times a week. Twice a week.. No specific days in the week   Yes Historical Provider, MD  etonogestrel-ethinyl estradiol (NUVARING) 0.12-0.015 MG/24HR vaginal ring Place 1 each vaginally every 28 (twenty-eight) days. Insert vaginally and leave in place for 3 consecutive weeks, then remove for 1 week.   Yes Historical Provider, MD  meloxicam (MOBIC) 15 MG tablet Take 15 mg by mouth daily as needed for pain.  10/11/13  Yes Wallene Huh, DPM  Multiple Vitamin (MULTIVITAMIN) capsule Take 1 capsule by mouth daily.   Yes Historical Provider, MD  omeprazole (PRILOSEC) 20 MG capsule Take 20 mg by mouth daily.   Yes Historical Provider, MD  sertraline (ZOLOFT) 100 MG tablet Take 100 mg by mouth daily.   Yes Historical Provider, MD   BP 142/78  Pulse 86  Temp(Src) 98.9 F (37.2 C)  Resp 19  SpO2 98% Physical Exam  Nursing note and vitals reviewed. Constitutional: She is oriented to person, place, and time. She appears well-developed and well-nourished. No distress.  HENT:  Head: Normocephalic and atraumatic.  Mouth/Throat: Oropharynx is clear and moist.  Eyes: Conjunctivae are normal. No scleral icterus.  Neck: Normal range of motion.  Cardiovascular: Normal rate, regular rhythm, normal heart sounds and intact distal pulses.   No murmur heard. Pulmonary/Chest: Effort normal and breath sounds normal. No respiratory distress. She has no wheezes.  Abdominal: Soft. Bowel sounds are normal. She exhibits no distension and no mass. There is tenderness in the left lower quadrant. There is guarding. There is no rebound and no CVA tenderness. Hernia confirmed negative in the right inguinal area and confirmed negative in the left inguinal area.  Well healed abdominoplasty scar TTP in the LLQ without rebound or peritoneal signs; voluntary guarding on exam Obese abd No CVA tenderness  Genitourinary: Uterus normal. No labial fusion. There is no rash, tenderness or lesion on the  right labia. There is no rash, tenderness or lesion on the left labia. Uterus is not deviated, not enlarged, not fixed and not tender. Cervix exhibits no motion tenderness, no discharge and no friability. Right adnexum displays no mass, no tenderness and no fullness. Left adnexum displays no mass, no tenderness and no fullness. No erythema, tenderness or bleeding around the vagina. No foreign body around the vagina. No signs of injury around the vagina. Vaginal discharge (scant, white) found.  Musculoskeletal: Normal range of motion. She exhibits no edema.  Lymphadenopathy:       Right: No inguinal adenopathy present.       Left: No inguinal adenopathy present.  Neurological: She is alert and oriented to person, place, and time. She exhibits normal muscle tone. Coordination normal.  Skin: Skin is warm and dry. She is not diaphoretic. No erythema.  Psychiatric: She has a normal mood and affect.    ED Course  Procedures (including critical care time) Labs Review Labs Reviewed  WET  PREP, GENITAL - Abnormal; Notable for the following:    Clue Cells Wet Prep HPF POC FEW (*)    WBC, Wet Prep HPF POC MANY (*)    All other components within normal limits  CBC WITH DIFFERENTIAL - Abnormal; Notable for the following:    WBC 12.8 (*)    Neutro Abs 9.3 (*)    All other components within normal limits  COMPREHENSIVE METABOLIC PANEL - Abnormal; Notable for the following:    Glucose, Bld 139 (*)    Anion gap 17 (*)    All other components within normal limits  URINALYSIS, ROUTINE W REFLEX MICROSCOPIC - Abnormal; Notable for the following:    APPearance CLOUDY (*)    Hgb urine dipstick TRACE (*)    Leukocytes, UA MODERATE (*)    All other components within normal limits  URINE MICROSCOPIC-ADD ON - Abnormal; Notable for the following:    Squamous Epithelial / LPF MANY (*)    Bacteria, UA FEW (*)    All other components within normal limits  GC/CHLAMYDIA PROBE AMP  URINE CULTURE  POC URINE PREG,  ED    Imaging Review US Transvaginal Non-ob  06/27/2014   CLINICAL DATA:  Left pelvic pain, leukocytosis, abnormal urinalysis, history of ovarian cysts  EXAM: TRANSABDOMINAL AND TRANSVAGINAL ULTRASOUND OF PELVIS  DOPPLER ULTRASOUND OF OVARIES  TECHNIQUE: Both transabdominal and transvaginal ultrasound examinations of the pelvis were performed. Transabdominal technique was performed for global imaging of the pelvis including uterus, ovaries, adnexal regions, and pelvic cul-de-sac.  It was necessary to proceed with endovaginal exam following the transabdominal exam to visualize the endometrium and left adnexa. Color and duplex Doppler ultrasound was utilized to evaluate blood flow to the ovaries.  COMPARISON:  None.  FINDINGS: Uterus  Measurements: 7.9 x 3.4 x 4.4 cm. No fibroids or other mass visualized.  Endometrium  Thickness: 8 mm.  No focal abnormality visualized.  Right ovary  Measurements: 2.6 x 1.3 x 1.9 cm. Normal appearance/no adnexal mass.  Left ovary  Not discretely visualized transabdominally or transvaginally.  Pulsed Doppler evaluation of both ovaries demonstrates normal low-resistance arterial and venous waveforms.  Other findings  Nuva ring in place.  IMPRESSION: Left ovary is not discretely visualized.  Otherwise negative pelvic ultrasound.  No evidence of right ovarian torsion.   Electronically Signed   By: Julian Hy M.D.   On: 06/27/2014 22:49   US Pelvis Complete  06/27/2014   CLINICAL DATA:  Left pelvic pain, leukocytosis, abnormal urinalysis, history of ovarian cysts  EXAM: TRANSABDOMINAL AND TRANSVAGINAL ULTRASOUND OF PELVIS  DOPPLER ULTRASOUND OF OVARIES  TECHNIQUE: Both transabdominal and transvaginal ultrasound examinations of the pelvis were performed. Transabdominal technique was performed for global imaging of the pelvis including uterus, ovaries, adnexal regions, and pelvic cul-de-sac.  It was necessary to proceed with endovaginal exam following the transabdominal exam to  visualize the endometrium and left adnexa. Color and duplex Doppler ultrasound was utilized to evaluate blood flow to the ovaries.  COMPARISON:  None.  FINDINGS: Uterus  Measurements: 7.9 x 3.4 x 4.4 cm. No fibroids or other mass visualized.  Endometrium  Thickness: 8 mm.  No focal abnormality visualized.  Right ovary  Measurements: 2.6 x 1.3 x 1.9 cm. Normal appearance/no adnexal mass.  Left ovary  Not discretely visualized transabdominally or transvaginally.  Pulsed Doppler evaluation of both ovaries demonstrates normal low-resistance arterial and venous waveforms.  Other findings  Nuva ring in place.  IMPRESSION: Left ovary is not discretely visualized.  Otherwise negative pelvic ultrasound.  No evidence of right ovarian torsion.   Electronically Signed   By: Julian Hy M.D.   On: 06/27/2014 22:49   [place] Art/ven Flow Abd Pelv Doppler  06/27/2014   CLINICAL DATA:  Left pelvic pain, leukocytosis, abnormal urinalysis, history of ovarian cysts  EXAM: TRANSABDOMINAL AND TRANSVAGINAL ULTRASOUND OF PELVIS  DOPPLER ULTRASOUND OF OVARIES  TECHNIQUE: Both transabdominal and transvaginal ultrasound examinations of the pelvis were performed. Transabdominal technique was performed for global imaging of the pelvis including uterus, ovaries, adnexal regions, and pelvic cul-de-sac.  It was necessary to proceed with endovaginal exam following the transabdominal exam to visualize the endometrium and left adnexa. Color and duplex Doppler ultrasound was utilized to evaluate blood flow to the ovaries.  COMPARISON:  None.  FINDINGS: Uterus  Measurements: 7.9 x 3.4 x 4.4 cm. No fibroids or other mass visualized.  Endometrium  Thickness: 8 mm.  No focal abnormality visualized.  Right ovary  Measurements: 2.6 x 1.3 x 1.9 cm. Normal appearance/no adnexal mass.  Left ovary  Not discretely visualized transabdominally or transvaginally.  Pulsed Doppler evaluation of both ovaries demonstrates normal low-resistance arterial and venous  waveforms.  Other findings  Nuva ring in place.  IMPRESSION: Left ovary is not discretely visualized.  Otherwise negative pelvic ultrasound.  No evidence of right ovarian torsion.   Electronically Signed   By: Julian Hy M.D.   On: 06/27/2014 22:49     EKG Interpretation None      MDM   Final diagnoses:  LLQ abdominal pain   Christy Muckleroy presents with LLQ abd tenderness without rebound or guarding. Pt with Hx of ovarian cyst and reports pain is similar to this.  Labs reassuring, though pt does have a leukocytosis.  .    Urine sample is dirty and pt denies all urinary complaints. Urine culture sent.  Patient declines pain control this time. Patient becomes tachycardic with movement.  The fluid bolus and reassess. We'll also obtain pelvic ultrasound.  10:30pm Pelvic ultrasound largely normal without evidence of ovarian torsion or cyst.  Patient now saying the pain is specifically located along her abdominoplasty incision site.  Will obtain CT scan abdomen and pelvis.  12:30PM Patient remains afebrile, non-tachycardic and in no significant distress. CT scan pending.  1:40 AM CT scan continues to pend.  Care transferred to Dr. Claudine Mouton who will follow and dispo accordingly.     Jarrett Soho Clydette Privitera, PA-C 06/28/14 0140

## 2014-06-27 NOTE — ED Notes (Signed)
[name], at the bedside.

## 2014-06-28 ENCOUNTER — Emergency Department (HOSPITAL_COMMUNITY): Payer: BC Managed Care – PPO

## 2014-06-28 ENCOUNTER — Encounter (HOSPITAL_COMMUNITY): Payer: Self-pay | Admitting: Radiology

## 2014-06-28 LAB — GC/CHLAMYDIA PROBE AMP
CT Probe RNA: NEGATIVE
GC Probe RNA: NEGATIVE

## 2014-06-28 MED ORDER — CEPHALEXIN 500 MG PO CAPS: 500.0000 mg | ORAL_CAPSULE | Freq: Two times a day (BID) | ORAL | Status: DC

## 2014-06-28 MED ORDER — IOHEXOL 300 MG/ML  SOLN
100.0000 mL | Freq: Once | INTRAMUSCULAR | Status: AC | PRN
  Administered 2014-06-28: 100 mL via INTRAVENOUS

## 2014-06-28 MED ORDER — METRONIDAZOLE 500 MG PO TABS: 500.0000 mg | ORAL_TABLET | Freq: Two times a day (BID) | ORAL | Status: DC

## 2014-06-28 NOTE — ED Provider Notes (Signed)
Patient was signed out to me as left lower quadrant abdominal pain with tenderness. CT scan was pending. It does not reveal any acute abnormality to explain the abdominal pain. Laboratory studies doesn't reveal a borderline UTI, as well as a few clue cells. We'll likely treat the patient for her BV and a UTI upon my repeat assessment. Vital signs remain within the patient's normal limits, she is safe for discharge.  Everlene Balls, MD 06/28/14 [phone]

## 2014-06-28 NOTE — ED Notes (Signed)
Patient finished CT contrast.

## 2014-06-28 NOTE — Discharge Instructions (Signed)
Abdominal Pain, Women [name], you were seen today for abdominal pain.  Your CT scan and ultrasound did not show a cause for your pain.  You did have a mild urine infection and bacterial vaginosis.  We will treat your infection and you need to follow up with your regular doctor within 3 days for continued care.  If any symptoms worsen, or you develop new symptoms, return to the ED immediately for repeat evaluation.  Thank you. Abdominal (stomach, pelvic, or belly) pain can be caused by many things. It is important to tell your doctor:  The location of the pain.  Does it come and go or is it present all the time?  Are there things that start the pain (eating certain foods, exercise)?  Are there other symptoms associated with the pain (fever, nausea, vomiting, diarrhea)? All of this is helpful to know when trying to find the cause of the pain. CAUSES   Stomach: virus or bacteria infection, or ulcer.  Intestine: appendicitis (inflamed appendix), regional ileitis (Crohn's disease), ulcerative colitis (inflamed colon), irritable bowel syndrome, diverticulitis (inflamed diverticulum of the colon), or cancer of the stomach or intestine.  Gallbladder disease or stones in the gallbladder.  Kidney disease, kidney stones, or infection.  Pancreas infection or cancer.  Fibromyalgia (pain disorder).  Diseases of the female organs:  Uterus: fibroid (non-cancerous) tumors or infection.  Fallopian tubes: infection or tubal pregnancy.  Ovary: cysts or tumors.  Pelvic adhesions (scar tissue).  Endometriosis (uterus lining tissue growing in the pelvis and on the pelvic organs).  Pelvic congestion syndrome (female organs filling up with blood just before the menstrual period).  Pain with the menstrual period.  Pain with ovulation (producing an egg).  Pain with an IUD (intrauterine device, birth control) in the uterus.  Cancer of the female organs.  Functional pain (pain not caused by a  disease, may improve without treatment).  Psychological pain.  Depression. DIAGNOSIS  Your doctor will decide the seriousness of your pain by doing an examination.  Blood tests.  X-rays.  Ultrasound.  CT scan (computed tomography, special type of X-ray).  MRI (magnetic resonance imaging).  Cultures, for infection.  Barium enema (dye inserted in the large intestine, to better view it with X-rays).  Colonoscopy (looking in intestine with a lighted tube).  Laparoscopy (minor surgery, looking in abdomen with a lighted tube).  Major abdominal exploratory surgery (looking in abdomen with a large incision). TREATMENT  The treatment will depend on the cause of the pain.   Many cases can be observed and treated at home.  Over-the-counter medicines recommended by your caregiver.  Prescription medicine.  Antibiotics, for infection.  Birth control pills, for painful periods or for ovulation pain.  Hormone treatment, for endometriosis.  Nerve blocking injections.  Physical therapy.  Antidepressants.  Counseling with a psychologist or psychiatrist.  Minor or major surgery. HOME CARE INSTRUCTIONS   Do not take laxatives, unless directed by your caregiver.  Take over-the-counter pain medicine only if ordered by your caregiver. Do not take aspirin because it can cause an upset stomach or bleeding.  Try a clear liquid diet (broth or water) as ordered by your caregiver. Slowly move to a bland diet, as tolerated, if the pain is related to the stomach or intestine.  Have a thermometer and take your temperature several times a day, and record it.  Bed rest and sleep, if it helps the pain.  Avoid sexual intercourse, if it causes pain.  Avoid stressful situations.  Keep your follow-up appointments and tests, as your caregiver orders.  If the pain does not go away with medicine or surgery, you may try:  Acupuncture.  Relaxation exercises (yoga, meditation).  Group  therapy.  Counseling. SEEK MEDICAL CARE IF:   You notice certain foods cause stomach pain.  Your home care treatment is not helping your pain.  You need stronger pain medicine.  You want your IUD removed.  You feel faint or lightheaded.  You develop nausea and vomiting.  You develop a rash.  You are having side effects or an allergy to your medicine. SEEK IMMEDIATE MEDICAL CARE IF:   Your pain does not go away or gets worse.  You have a fever.  Your pain is felt only in portions of the abdomen. The right side could possibly be appendicitis. The left lower portion of the abdomen could be colitis or diverticulitis.  You are passing blood in your stools (bright red or black tarry stools, with or without vomiting).  You have blood in your urine.  You develop chills, with or without a fever.  You pass out. MAKE SURE YOU:   Understand these instructions.  Will watch your condition.  Will get help right away if you are not doing well or get worse. Document Released: 08/04/2007 Document Revised: 02/21/2014 Document Reviewed: 08/24/2009 [facility] Patient Information 2015 [name], [place]. This information is not intended to replace advice given to you by your health care provider. Make sure you discuss any questions you have with your health care provider.      CT Abdomen Pelvis W Contrast (Final result)  Result time: 06/28/14 02:00:56    Final result by Rad Results In Interface (06/28/14 02:00:56)    Narrative:   CLINICAL DATA: Left pelvic pain  EXAM: CT ABDOMEN AND PELVIS WITH CONTRAST  TECHNIQUE: Multidetector CT imaging of the abdomen and pelvis was performed using the standard protocol following bolus administration of intravenous contrast.  CONTRAST: 126mL OMNIPAQUE IOHEXOL 300 MG/ML SOLN  COMPARISON: 01/24/2009  FINDINGS: Lung bases are clear.  Liver, spleen, pancreas, and adrenal glands are within normal limits.  Status post cholecystectomy. No  intrahepatic or extrahepatic ductal dilatation.  Kidneys are within normal limits. No hydronephrosis.  No evidence of bowel obstruction. Normal appendix.  Atherosclerotic calcifications of the abdominal aorta and branch vessels.  No abdominopelvic ascites.  No suspicious abdominopelvic lymphadenopathy.  Uterus and right ovary are within normal limits. Left ovary is not well visualized but there is no evidence of left adnexal mass. Intravaginal contraceptive ring.  Bladder is within normal limits.  Degenerative changes of the visualized thoracolumbar spine.  IMPRESSION: No evidence of bowel obstruction. Normal appendix.  No CT findings to account for the patient's left abdominal pain.   Electronically Signed By: Julian Hy M.D. On: 06/28/2014 02:00             US Pelvis Complete (Final result)  Result time: 06/27/14 22:49:55    Final result by Rad Results In Interface (06/27/14 22:49:55)    Narrative:   CLINICAL DATA: Left pelvic pain, leukocytosis, abnormal urinalysis, history of ovarian cysts  EXAM: TRANSABDOMINAL AND TRANSVAGINAL ULTRASOUND OF PELVIS  DOPPLER ULTRASOUND OF OVARIES  TECHNIQUE: Both transabdominal and transvaginal ultrasound examinations of the pelvis were performed. Transabdominal technique was performed for global imaging of the pelvis including uterus, ovaries, adnexal regions, and pelvic cul-de-sac.  It was necessary to proceed with endovaginal exam following the transabdominal exam to visualize the endometrium and left adnexa. Color and duplex Doppler  ultrasound was utilized to evaluate blood flow to the ovaries.  COMPARISON: None.  FINDINGS: Uterus  Measurements: 7.9 x 3.4 x 4.4 cm. No fibroids or other mass visualized.  Endometrium  Thickness: 8 mm. No focal abnormality visualized.  Right ovary  Measurements: 2.6 x 1.3 x 1.9 cm. Normal appearance/no adnexal mass.  Left ovary  Not discretely visualized  transabdominally or transvaginally.  Pulsed Doppler evaluation of both ovaries demonstrates normal low-resistance arterial and venous waveforms.  Other findings  Nuva ring in place.  IMPRESSION: Left ovary is not discretely visualized.  Otherwise negative pelvic ultrasound.  No evidence of right ovarian torsion.   Electronically Signed By: Julian Hy M.D. On: 06/27/2014 22:49             [place] Art/Ven Flow Abd Pelv Doppler (Final result)  Result time: 06/27/14 22:49:55    Final result by Rad Results In Interface (06/27/14 22:49:55)    Narrative:   CLINICAL DATA: Left pelvic pain, leukocytosis, abnormal urinalysis, history of ovarian cysts  EXAM: TRANSABDOMINAL AND TRANSVAGINAL ULTRASOUND OF PELVIS  DOPPLER ULTRASOUND OF OVARIES  TECHNIQUE: Both transabdominal and transvaginal ultrasound examinations of the pelvis were performed. Transabdominal technique was performed for global imaging of the pelvis including uterus, ovaries, adnexal regions, and pelvic cul-de-sac.  It was necessary to proceed with endovaginal exam following the transabdominal exam to visualize the endometrium and left adnexa. Color and duplex Doppler ultrasound was utilized to evaluate blood flow to the ovaries.  COMPARISON: None.  FINDINGS: Uterus  Measurements: 7.9 x 3.4 x 4.4 cm. No fibroids or other mass visualized.  Endometrium  Thickness: 8 mm. No focal abnormality visualized.  Right ovary  Measurements: 2.6 x 1.3 x 1.9 cm. Normal appearance/no adnexal mass.  Left ovary  Not discretely visualized transabdominally or transvaginally.  Pulsed Doppler evaluation of both ovaries demonstrates normal low-resistance arterial and venous waveforms.  Other findings  Nuva ring in place.  IMPRESSION: Left ovary is not discretely visualized.  Otherwise negative pelvic ultrasound.  No evidence of right ovarian torsion.   Electronically Signed By: Julian Hy  M.D. On: 06/27/2014 22:49             US Transvaginal Non-OB (Final result)  Result time: 06/27/14 22:49:55    Final result by Rad Results In Interface (06/27/14 22:49:55)    Narrative:   CLINICAL DATA: Left pelvic pain, leukocytosis, abnormal urinalysis, history of ovarian cysts  EXAM: TRANSABDOMINAL AND TRANSVAGINAL ULTRASOUND OF PELVIS  DOPPLER ULTRASOUND OF OVARIES  TECHNIQUE: Both transabdominal and transvaginal ultrasound examinations of the pelvis were performed. Transabdominal technique was performed for global imaging of the pelvis including uterus, ovaries, adnexal regions, and pelvic cul-de-sac.  It was necessary to proceed with endovaginal exam following the transabdominal exam to visualize the endometrium and left adnexa. Color and duplex Doppler ultrasound was utilized to evaluate blood flow to the ovaries.  COMPARISON: None.  FINDINGS: Uterus  Measurements: 7.9 x 3.4 x 4.4 cm. No fibroids or other mass visualized.  Endometrium  Thickness: 8 mm. No focal abnormality visualized.  Right ovary  Measurements: 2.6 x 1.3 x 1.9 cm. Normal appearance/no adnexal mass.  Left ovary  Not discretely visualized transabdominally or transvaginally.  Pulsed Doppler evaluation of both ovaries demonstrates normal low-resistance arterial and venous waveforms.  Other findings  Nuva ring in place.  IMPRESSION: Left ovary is not discretely visualized.  Otherwise negative pelvic ultrasound.  No evidence of right ovarian torsion.

## 2014-06-28 NOTE — ED Provider Notes (Signed)
Medical screening examination/treatment/procedure(s) were performed by non-physician practitioner and as supervising physician I was immediately available for consultation/collaboration.   EKG Interpretation None        Orpah Greek, MD 06/28/14 [phone]

## 2014-06-28 NOTE — ED Notes (Signed)
Called Chris in CT, patient has finished drinking contrast.

## 2014-06-28 NOTE — ED Notes (Signed)
Patient is anxious and requesting to leave with results of CT.  Reviewed with her and Dr. Claudine Mouton that CT was negative, and that the patient may possibly have a UTI.  Dr. Claudine Mouton will see the patient soon.

## 2014-06-29 LAB — URINE CULTURE
Colony Count: 25000
SPECIAL REQUESTS: NORMAL

## 2014-08-04 ENCOUNTER — Encounter: Payer: Self-pay | Admitting: Internal Medicine

## 2014-08-04 ENCOUNTER — Ambulatory Visit (INDEPENDENT_AMBULATORY_CARE_PROVIDER_SITE_OTHER): Payer: BC Managed Care – PPO | Admitting: Internal Medicine

## 2014-08-04 VITALS — BP 118/78 | HR 93 | Temp 98.1°F | Resp 12 | Ht 63.0 in | Wt 242.0 lb

## 2014-08-04 DIAGNOSIS — R61 Generalized hyperhidrosis: Secondary | ICD-10-CM

## 2014-08-04 NOTE — Progress Notes (Signed)
Patient ID: [name], female   DOB: May 24, 1971, 43 y.o.   MRN: [id number]   HPI  [name] is a 43 y.o.-year-old female, referred by her PCP, Dr. Drema Dallas, in consultation for excessive sweating.  Pt describes: - weight gain - lost 90 lbs in 2006 on Meridia - maintained but then started to gain it back in 2012: 75 lbs  - perimenopause >> started on NuvaRing >> then added Vivelle Dot  - fatigue >> started not to able to sleep less >> sleeping aids >> could not wake up in am to exercise - now also depression 2/2 her weight and also excessive sweating  She describes severe sweating - throughout the day, embarrassing for her  - started this year - mostly face and back of neck - continuous, except at night - no flushing - sometimes when feeling anxious  She started Omeprazole 6 mo ago. She started Atenolol 12.5 mg 07/2013 (went to the hospital with CP).  She is on Estradiol patch and also NuvaRing. Dose of Vivelle Dot patch was increased in 12/2013.   She gets flushing and sweating with alcohol, but she only rarely has this.  I reviewed pt's thyroid tests: 06/18/2014: TSH 3.32, fT4 0.87, TT3 186 (71-180) - slightly higher likely 2/2 increase TBG 2/2 Estrogen Lab Results  Component Value Date   TSH 2.392 07/27/2012   FREET4 1.01 02/06/2012   No h/o hypertensive episodes - she had high BP 2 years ago when she went to the ED with CP >> not since. No h/o flushing, diarrhea, wheezing. No h/o leukemia or lymphoma (WBC slightly higher at last check by PCP, at 11.8 (4-11)) Had a TB test at work >> normal. She does have anxiety, but the sweating is constant even when moves through her house.  No h/o DM2-  Glucose normal per latest labs from PCP.  She also has a h/o HL, GERD, and back pain. She was started on Prednisone and mm relaxer this week.  ROS: Constitutional: + weight gain (75 lbs in the last 3 years, after initially losing 90 on Meridia), no fatigue, + subjective  hyperthermia, + poor sleep, + nocturia Eyes: no blurry vision, no xerophthalmia ENT: no sore throat, no nodules palpated in throat, no dysphagia/odynophagia, no hoarseness Cardiovascular: no CP/+ SOB/no palpitations/leg swelling Respiratory: no cough/+ SOB Gastrointestinal: no N/V/D/C Musculoskeletal: no muscle/joint aches Skin: no rashes Neurological: no tremors/numbness/tingling/dizziness Psychiatric: + depression/+ anxiety  Past Medical History  Diagnosis Date  . Abnormal Pap smear   . High cholesterol   . Chest pain at rest, with strong family history of CAD. 07/27/2012  . Hyperlipidemia 07/27/2012  . 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 Hypertension    Past Surgical History  Procedure Laterality Date  . Cholecystectomy      2003  . Hernia repair      2008  . Foot surgery      2004  . Carpal tunnel release      2001  . Cosmetic surgery      2009  . Cardiovascular stress test  10/01/2012    No significant ST segment change suggestive of ischemia.  . Transthoracic echocardiogram  04/24/2010    EF >55%, normal LV systolic function   History   Social History  . Marital Status: Divorced    Spouse Name: N/A    Number of Children: 1   Occupational History  . [employer] account manager   Social  History Main Topics  . Smoking status: Never Smoker   . Smokeless tobacco: Never Used  . Alcohol Use: Wine 4-5x a year.  . Drug Use: No  . Sexual Activity: Yes    Birth Control/ Protection: Pill, Inserts   Current Outpatient Prescriptions on File Prior to Visit  Medication Sig Dispense Refill  . aspirin 81 MG EC tablet Take 81 mg by mouth daily.      Marland Kitchen atenolol (TENORMIN) 25 MG tablet Take 12.5 mg by mouth at bedtime.      Marland Kitchen atorvastatin (LIPITOR) 40 MG tablet Take 40 mg by mouth daily.      Marland Kitchen BIOTIN 5000 PO Take 1 tablet by mouth daily.      . Calcium Carb-Cholecalciferol (CALCIUM 1000 + D PO) Take 1 tablet by mouth  daily.      . cephALEXin (KEFLEX) 500 MG capsule Take 1 capsule (500 mg total) by mouth 2 (two) times daily.  10 capsule  0  . docusate sodium (COLACE) 100 MG capsule Take 100 mg by mouth 2 (two) times daily.      Marland Kitchen estradiol (VIVELLE-DOT) 0.05 MG/24HR patch Place 1 patch onto the skin 2 (two) times a week. Twice a week.. No specific days in the week      . etonogestrel-ethinyl estradiol (NUVARING) 0.12-0.015 MG/24HR vaginal ring Place 1 each vaginally every 28 (twenty-eight) days. Insert vaginally and leave in place for 3 consecutive weeks, then remove for 1 week.      . meloxicam (MOBIC) 15 MG tablet Take 15 mg by mouth daily as needed for pain.       . metroNIDAZOLE (FLAGYL) 500 MG tablet Take 1 tablet (500 mg total) by mouth 2 (two) times daily. One po bid x 7 days  14 tablet  0  . Multiple Vitamin (MULTIVITAMIN) capsule Take 1 capsule by mouth daily.      Marland Kitchen omeprazole (PRILOSEC) 20 MG capsule Take 20 mg by mouth daily.      . sertraline (ZOLOFT) 100 MG tablet Take 100 mg by mouth daily.       No current facility-administered medications on file prior to visit.   No Known Allergies Family History  Problem Relation Age of Onset  . Diabetes Paternal Grandfather   . Breast cancer Paternal Grandmother   . Breast cancer Maternal Grandmother   . Diabetes Father   . Coronary artery disease Father   . Coronary artery disease Mother    PE: BP 118/78  Pulse 93  Temp(Src) 98.1 F (36.7 C) (Oral)  Resp 12  Ht 5\' 3"  (1.6 m)  Wt 242 lb (109.77 kg)  BMI 42.88 kg/m2  SpO2 95% Wt Readings from Last 3 Encounters:  08/04/14 242 lb (109.77 kg)  10/18/13 220 lb (99.791 kg)  10/01/12 197 lb (89.359 kg)   Constitutional: overweight, in NAD Eyes: PERRLA, EOMI, no exophthalmos ENT: moist mucous membranes, no thyromegaly, no cervical lymphadenopathy Cardiovascular: RRR, No MRG Respiratory: CTA B Gastrointestinal: abdomen soft, NT, ND, BS+ Musculoskeletal: no deformities, strength intact in all  4 Skin: moist, warm, no rashes Neurological: no tremor with outstretched hands, DTR normal in all 4  ASSESSMENT: 1. Excessive sweating  PLAN:  1. Patient with a h/o perimenopause, on estradiol tx (NuvaRing and E2 patch), c/o excessive sweating - she feels drenched throughout the day, less so at night. The distribution is mostly in the head (sweat dripping - has to wear a cloth in her purse all the time), but has sweating in  the rest of the body, too. - There are few endocrine disorder that cause sweating only, without flushing:  DM with hypoglycemia - she feels well when she has the episodes, and no h/o DM  Hyperthyroidism - recent TFTs normal  Pheochromocytoma - no high BP episodes, no HAs   Acromegaly - no increase in size of fingers and show size - usually sweating is more in the body folds/palms, not head  [place] sd. - she does not have other sxs other than her weight gain, but we may need to check this (now on Prednisone) - Carcinoid, Medullary thyroid cancer, systemic mastocytosis - usually present with flushing. - We discussed that certain drugs can cause sweating, e.g. Beta blockers, Omeprazole. He needs the omeprazole but she takes a low dose of Atenolol that was originally prescribed 2 years ago for her BP episode at that time (per her report) >> advised to stop it and see how she feels w/o it. She has a higher HR today but she appears anxious and I feel this may play a role. - I also advised her to d/w Dr Leo Grosser whether she is supposed to take both estrogen formulations, as she is not quite sure. - I also suggested Relizen, a natural formulation that was shown to greatly improve hot flushes  - I advised her to return in 3 mo after she has tried the above. We may need testing for the above conditions then and if negative, may try Clonidine.

## 2014-08-04 NOTE — Patient Instructions (Signed)
Please stop Atenolol. Please discuss with Dr Leo Grosser whether you should be on both vaginal and transdermal estrogen. Try Relizen to help with the sweating. Please come back for a follow-up appointment in 3 months, if the symptoms do not improve with the above measures. We may need to do additional testing then.

## 2014-08-05 ENCOUNTER — Other Ambulatory Visit: Payer: Self-pay

## 2014-08-22 ENCOUNTER — Encounter: Payer: Self-pay | Admitting: Internal Medicine

## 2014-08-29 ENCOUNTER — Other Ambulatory Visit: Payer: Self-pay

## 2014-08-29 DIAGNOSIS — Z1231 Encounter for screening mammogram for malignant neoplasm of breast: Secondary | ICD-10-CM

## 2014-08-29 DIAGNOSIS — Z9889 Other specified postprocedural states: Secondary | ICD-10-CM

## 2014-09-06 ENCOUNTER — Telehealth: Payer: Self-pay | Admitting: Cardiology

## 2014-09-06 ENCOUNTER — Telehealth: Payer: Self-pay | Admitting: Cardiovascular Disease

## 2014-09-06 NOTE — Telephone Encounter (Signed)
Received EKG from [place] ( Dr Leighton Ruff) for appointment with [name], [place] on 09/07/14.  Records (EKG) given to [employer] (medical records) for Luke's schedule on 09/07/14.  lp

## 2014-09-06 NOTE — Telephone Encounter (Signed)
Close encounter 

## 2014-09-07 ENCOUNTER — Ambulatory Visit (INDEPENDENT_AMBULATORY_CARE_PROVIDER_SITE_OTHER): Payer: BC Managed Care – PPO | Admitting: Cardiology

## 2014-09-07 ENCOUNTER — Encounter: Payer: Self-pay | Admitting: Cardiology

## 2014-09-07 DIAGNOSIS — R0609 Other forms of dyspnea: Secondary | ICD-10-CM

## 2014-09-07 DIAGNOSIS — E785 Hyperlipidemia, unspecified: Secondary | ICD-10-CM

## 2014-09-07 DIAGNOSIS — R079 Chest pain, unspecified: Secondary | ICD-10-CM

## 2014-09-07 DIAGNOSIS — Z8249 Family history of ischemic heart disease and other diseases of the circulatory system: Secondary | ICD-10-CM

## 2014-09-07 DIAGNOSIS — M549 Dorsalgia, unspecified: Secondary | ICD-10-CM | POA: Insufficient documentation

## 2014-09-07 DIAGNOSIS — E039 Hypothyroidism, unspecified: Secondary | ICD-10-CM

## 2014-09-07 DIAGNOSIS — I2510 Atherosclerotic heart disease of native coronary artery without angina pectoris: Secondary | ICD-10-CM

## 2014-09-07 DIAGNOSIS — I2583 Coronary atherosclerosis due to lipid rich plaque: Principal | ICD-10-CM

## 2014-09-07 DIAGNOSIS — R0600 Dyspnea, unspecified: Secondary | ICD-10-CM | POA: Insufficient documentation

## 2014-09-07 DIAGNOSIS — R0602 Shortness of breath: Secondary | ICD-10-CM

## 2014-09-07 MED ORDER — ATENOLOL 25 MG PO TABS: 25.0000 mg | ORAL_TABLET | Freq: Every day | ORAL | Status: DC

## 2014-09-07 NOTE — Assessment & Plan Note (Signed)
Chest tightness, DOE with a strong family history of CAD and some degree of CAD previously documented by CT.

## 2014-09-07 NOTE — Patient Instructions (Signed)
Increase Atenolol to 25mg  daily.  A new Rx has been sent to your pharmacy.  Your physician has requested that you have an echocardiogram. Echocardiography is a painless test that uses sound waves to create images of your heart. It provides your doctor with information about the size and shape of your heart and how well your heart's chambers and valves are working. This procedure takes approximately one hour. There are no restrictions for this procedure.  Your physician has requested that you have a 2 day lexiscan myoview. For further information please visit [email or web address]. Please follow instruction sheet, as given.  Your physician recommends that you schedule a follow-up appointment in: after testing with Dr. Gwenlyn Found.

## 2014-09-07 NOTE — Assessment & Plan Note (Signed)
BMI 44, she has gained > 50 lbs in the past year

## 2014-09-07 NOTE — Assessment & Plan Note (Signed)
She says this has been new since the summer.

## 2014-09-07 NOTE — Assessment & Plan Note (Signed)
Recently started on Synthroid 0.025 mg by PCP

## 2014-09-07 NOTE — Assessment & Plan Note (Signed)
On Lipitor 40 mg 

## 2014-09-07 NOTE — Assessment & Plan Note (Signed)
Her father was a pt of Dr Gwenlyn Found, he passed this summer at age 45.

## 2014-09-07 NOTE — Assessment & Plan Note (Signed)
CAD by CT, low risk Myoview Dec 2013

## 2014-09-07 NOTE — Assessment & Plan Note (Signed)
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 This has prevented her from exercising.

## 2014-09-07 NOTE — Assessment & Plan Note (Signed)
Sub optimal contrrol

## 2014-09-07 NOTE — Progress Notes (Signed)
09/07/2014 [name]   16-Nov-1970  [id number]  Primary Physician Gerrit Heck, MD Primary Cardiologist: Dr Gwenlyn Found  HPI:  The pt is a 43 year old, morbidly obese, divorced Caucasian female, mother of 1 whose parents are patients of Dr Gwenlyn Found. Her father died this summer at age 58. She has a history of hypertension, hyperlipidemia and positive family history for heart disease as noted above. She had a CT angiogram at [facility] in 2013 which revealed mild to moderate CAD with a calcium score of 130. She did complain of chest pain and shortness of breath then. She had a Myoview stress test performed in our office October 02, 2012, which was entirely normal. She last saw Dr Gwenlyn Found 10/18/13 and was doing well.            She was seen by her PCP, Dr Leighton Ruff, recently. She has been diagnosed with mild hypothyroidism and has been placed on low dose Synthroid. While at Dr Drema [place] office she related a history of DOE since this summer. She was encouraged to call here for an appointment.           Since this summer she has noted increasing DOE. She has gained > 50lbs over the past year. She says she can't exercise because of some back problems. She admits to occasional palpitations and chest tightness as well. She denies any hemoptysis or history of DVT.                 Current Outpatient Prescriptions  Medication Sig Dispense Refill  . aspirin 81 MG EC tablet Take 81 mg by mouth daily.    Marland Kitchen atenolol (TENORMIN) 25 MG tablet Take 1 tablet (25 mg total) by mouth at bedtime. 90 tablet 3  . atorvastatin (LIPITOR) 40 MG tablet Take 40 mg by mouth daily.    Marland Kitchen BIOTIN 5000 PO Take 1 tablet by mouth daily.    . Calcium Carb-Cholecalciferol (CALCIUM 1000 + D PO) Take 1 tablet by mouth daily.    Marland Kitchen etonogestrel-ethinyl estradiol (NUVARING) 0.12-0.015 MG/24HR vaginal ring Place 1 each vaginally every 28 (twenty-eight) days. Insert vaginally and leave in place for 3 consecutive weeks, then remove for  1 week.    . levothyroxine (SYNTHROID, LEVOTHROID) 25 MCG tablet Take 25 mcg by mouth daily.  0  . meloxicam (MOBIC) 15 MG tablet Take 15 mg by mouth daily as needed for pain.     . methocarbamol (ROBAXIN) 750 MG tablet     . Multiple Vitamin (MULTIVITAMIN) capsule Take 1 capsule by mouth daily.    Marland Kitchen omeprazole (PRILOSEC) 20 MG capsule Take 20 mg by mouth daily.    . sertraline (ZOLOFT) 100 MG tablet Take 100 mg by mouth daily.     No current facility-administered medications for this visit.    No Known Allergies  History   Social History  . Marital Status: Divorced    Spouse Name: N/A    Number of Children: N/A  . Years of Education: N/A   Occupational History  . Not on file.   Social History Main Topics  . Smoking status: Never Smoker   . Smokeless tobacco: Never Used  . Alcohol Use: No  . Drug Use: No  . Sexual Activity: Yes    Birth Control/ Protection: Pill, Inserts   Other Topics Concern  . Not on file   Social History Narrative     Review of Systems: General: negative for chills, fever Cardiovascular: negative for edema, orthopnea Dermatological: negative  for rash Respiratory: negative for cough or wheezing Urologic: negative for hematuria Abdominal: negative for nausea, vomiting, diarrhea, bright red blood per rectum, melena, or hematemesis Neurologic: negative for visual changes, syncope, or dizziness All other systems reviewed and are otherwise negative except as noted above.    Blood pressure 160/86, pulse 95, height 5\' 3"  (1.6 m), weight 249 lb 3.2 oz (113.036 kg).  General appearance: alert, cooperative, no distress, morbidly obese and anxious Neck: no carotid bruit and no JVD Lungs: clear to auscultation bilaterally Heart: regular rate and rhythm and increased rate Extremities: no edema Skin: Skin color, texture, turgor normal. No rashes or lesions or sweaty Neurologic: Grossly normal  EKG: NSR  ASSESSMENT AND PLAN:   DOE (dyspnea on  exertion) She says this has been new since the summer.  Chest pain with moderate risk of acute coronary syndrome Chest tightness, DOE with a strong family history of CAD and some degree of CAD previously documented by CT.  CAD (coronary artery disease), non obstrustive by cardiac CT CAD by CT, low risk Myoview Dec 2013  Essential hypertension Sub optimal contrrol  Hyperlipidemia On Lipitor 40 mg  Morbid obesity BMI 44, she has gained > 50 lbs in the past year  Hypothyroid Recently started on Synthroid 0.025 mg by PCP  Family history of coronary artery disease Her father was a pt of Dr Gwenlyn Found, he passed this summer at age 19.  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 This has prevented her from exercising.   PLAN  Her O2 sat with ambulation on RA was 97-96. She was tachycardic with exercise, her HR went from 98-118. I ordered an echo and two day Lexiscan Myoview. She will follow up with Dr Gwenlyn Found after this. I also suggested she increase her Tenormin to 25 mg daily.   [facility] 09/07/2014 4:32 PM

## 2014-09-08 ENCOUNTER — Other Ambulatory Visit (HOSPITAL_COMMUNITY): Payer: Self-pay | Admitting: Cardiovascular Disease

## 2014-09-08 DIAGNOSIS — R079 Chest pain, unspecified: Secondary | ICD-10-CM

## 2014-09-12 ENCOUNTER — Encounter: Payer: Self-pay | Admitting: Cardiovascular Disease

## 2014-09-14 ENCOUNTER — Telehealth (HOSPITAL_COMMUNITY): Payer: Self-pay

## 2014-09-14 NOTE — Telephone Encounter (Signed)
Encounter complete. 

## 2014-09-20 ENCOUNTER — Telehealth: Payer: Self-pay | Admitting: Cardiovascular Disease

## 2014-09-21 ENCOUNTER — Ambulatory Visit (HOSPITAL_BASED_OUTPATIENT_CLINIC_OR_DEPARTMENT_OTHER)
Admission: RE | Admit: 2014-09-21 | Discharge: 2014-09-21 | Disposition: A | Discharge status: Home / Self Care | Payer: BC Managed Care – PPO | Source: Ambulatory Visit | Attending: Cardiovascular Disease | Admitting: Cardiovascular Disease

## 2014-09-21 ENCOUNTER — Ambulatory Visit (HOSPITAL_COMMUNITY)
Admission: RE | Admit: 2014-09-21 | Discharge: 2014-09-21 | Disposition: A | Discharge status: Home / Self Care | Payer: BC Managed Care – PPO | Source: Ambulatory Visit | Attending: Cardiovascular Disease | Admitting: Cardiovascular Disease

## 2014-09-21 DIAGNOSIS — E785 Hyperlipidemia, unspecified: Secondary | ICD-10-CM | POA: Diagnosis not present

## 2014-09-21 DIAGNOSIS — Z8249 Family history of ischemic heart disease and other diseases of the circulatory system: Secondary | ICD-10-CM | POA: Insufficient documentation

## 2014-09-21 DIAGNOSIS — R0609 Other forms of dyspnea: Secondary | ICD-10-CM | POA: Insufficient documentation

## 2014-09-21 DIAGNOSIS — E669 Obesity, unspecified: Secondary | ICD-10-CM | POA: Insufficient documentation

## 2014-09-21 DIAGNOSIS — R5383 Other fatigue: Secondary | ICD-10-CM | POA: Insufficient documentation

## 2014-09-21 DIAGNOSIS — R079 Chest pain, unspecified: Secondary | ICD-10-CM

## 2014-09-21 DIAGNOSIS — R0602 Shortness of breath: Secondary | ICD-10-CM

## 2014-09-21 DIAGNOSIS — R42 Dizziness and giddiness: Secondary | ICD-10-CM | POA: Insufficient documentation

## 2014-09-21 DIAGNOSIS — R002 Palpitations: Secondary | ICD-10-CM | POA: Diagnosis not present

## 2014-09-21 DIAGNOSIS — I10 Essential (primary) hypertension: Secondary | ICD-10-CM | POA: Insufficient documentation

## 2014-09-21 MED ORDER — TECHNETIUM TC 99M SESTAMIBI GENERIC - CARDIOLITE
31.7000 | Freq: Once | INTRAVENOUS | Status: AC | PRN
  Administered 2014-09-21: 31.7 via INTRAVENOUS

## 2014-09-21 MED ORDER — REGADENOSON 0.4 MG/5ML IV SOLN
0.4000 mg | Freq: Once | INTRAVENOUS | Status: AC
  Administered 2014-09-21: 0.4 mg via INTRAVENOUS

## 2014-09-21 NOTE — Telephone Encounter (Signed)
Closed encounter °

## 2014-09-21 NOTE — Progress Notes (Signed)
2D Echocardiogram Complete.  09/21/2014   [name] [place], [place]

## 2014-09-21 NOTE — Procedures (Addendum)
[facility] [address] [place] [address] [phone]  Cardiology Nuclear Med Study  [name] is a 43 y.o. female     MRN : [id number]     DOB: 1970-11-30  Procedure Date: 09/21/2014  Nuclear Med Background Indication for Stress Test:  Evaluation for Ischemia History:  CAD-nonobstructive per cardiac CT;Last NUC MPI on 10/01/2012-nonischemic;EF=63% Cardiac Risk Factors: Family History - CAD, Hypertension, Lipids and Obesity  Symptoms:  Chest Pain, DOE, Fatigue, Light-Headedness and Palpitations   Nuclear Pre-Procedure Caffeine/Decaff Intake:  7:00pm NPO After: 5:00am   IV Site: R Forearm  IV 0.9% NS with Angio Cath:  22g  Chest Size (in):  n/a IV Started by: Rolene Course, RN  Height: 5\' 3"  (1.6 m)  Cup Size: DDD;Pt is s/p bilateral breast reduction  BMI:  Body mass index is 44.12 kg/(m^2). Weight:  249 lb (112.946 kg)   Tech Comments:  n/a    Nuclear Med Study 1 or 2 day study: 2 day  Stress Test Type:  [place] Provider:  Quay Burow, MD   Resting Radionuclide: Technetium 20m Sestamibi  Resting Radionuclide Dose: 31.1 mCi   Stress Radionuclide:  Technetium 64m Sestamibi  Stress Radionuclide Dose: 31.7 mCi           Stress Protocol Rest HR:  71 Stress HR: 100  Rest BP: 129/70 Stress BP: 110/70  Exercise Time (min): n/a METS: n/a   Predicted Max HR: 175 bpm % Max HR: 57.14 bpm Rate Pressure Product: 12900  Dose of Adenosine (mg):  n/a Dose of Lexiscan: 0.4 mg  Dose of Atropine (mg): n/a Dose of Dobutamine: n/a mcg/kg/min (at max HR)  Stress Test Technologist: Leane Para, CCT Nuclear Technologist: Imagene Riches, CNMT   Rest Procedure:  Myocardial perfusion imaging was performed at rest 45 minutes following the intravenous administration of Technetium 42m Sestamibi. Stress Procedure:  The patient received IV Lexiscan 0.4 mg over 15-seconds.  Technetium 77m Sestamibi injected  IV at 30-seconds.  There were no significant changes with Lexiscan.  Quantitative spect images were obtained after a 45 minute delay.  Transient Ischemic Dilatation (Normal <1.22):  0.88  QGS EDV:  70 ml QGS ESV:  22 ml LV Ejection Fraction: 68%     Rest ECG: NSR - Normal EKG  Stress ECG: No significant change from baseline ECG  QPS Raw Data Images:  Mild breast attenuation.  Normal left ventricular size. Stress Images:  There is decreased uptake in the anterior wall. The defect is mild in severity and moderate in size, involving the mid and apical segments of the anterior and anterolateral walls. Rest Images:  there is improved anterior wall uptake Subtraction (SDS):  These findings are consistent with ischemia. LV Wall Motion:  NL LV Function; NL Wall Motion  Impression Exercise Capacity:  Lexiscan with no exercise. BP Response:  Normal blood pressure response. Clinical Symptoms:  No significant symptoms noted. ECG Impression:  No significant ECG changes with Lexiscan. Comparison with Prior Nuclear Study: anterior perfusion abnormality   Overall Impression:  High risk stress nuclear study with a reversible anterior wall perfusion abnormality in the distribution of the LAD artery..  This was a 2-day study in a patient with marked breast attenuation artifact. High likelihood of a "shifting breast artifact".  Sanda Klein, MD  09/22/2014 5:25 PM

## 2014-09-22 ENCOUNTER — Ambulatory Visit (HOSPITAL_COMMUNITY)
Admission: RE | Admit: 2014-09-22 | Discharge: 2014-09-22 | Disposition: A | Discharge status: Home / Self Care | Payer: BC Managed Care – PPO | Source: Ambulatory Visit | Attending: Cardiovascular Disease | Admitting: Cardiovascular Disease

## 2014-09-22 ENCOUNTER — Other Ambulatory Visit: Payer: Self-pay | Admitting: *Deleted

## 2014-09-22 ENCOUNTER — Ambulatory Visit: Payer: BC Managed Care – PPO

## 2014-09-22 DIAGNOSIS — R079 Chest pain, unspecified: Secondary | ICD-10-CM | POA: Diagnosis not present

## 2014-09-22 MED ORDER — TECHNETIUM TC 99M SESTAMIBI GENERIC - CARDIOLITE
31.1000 | Freq: Once | INTRAVENOUS | Status: AC | PRN
  Administered 2014-09-22: 31.1 via INTRAVENOUS

## 2014-09-26 ENCOUNTER — Ambulatory Visit (INDEPENDENT_AMBULATORY_CARE_PROVIDER_SITE_OTHER): Payer: BC Managed Care – PPO | Admitting: Cardiovascular Disease

## 2014-09-26 ENCOUNTER — Encounter: Payer: Self-pay | Admitting: Cardiovascular Disease

## 2014-09-26 DIAGNOSIS — I25119 Atherosclerotic heart disease of native coronary artery with unspecified angina pectoris: Secondary | ICD-10-CM

## 2014-09-26 DIAGNOSIS — D689 Coagulation defect, unspecified: Secondary | ICD-10-CM

## 2014-09-26 DIAGNOSIS — R9439 Abnormal result of other cardiovascular function study: Secondary | ICD-10-CM

## 2014-09-26 DIAGNOSIS — I10 Essential (primary) hypertension: Secondary | ICD-10-CM

## 2014-09-26 DIAGNOSIS — R079 Chest pain, unspecified: Secondary | ICD-10-CM

## 2014-09-26 DIAGNOSIS — Z01818 Encounter for other preprocedural examination: Secondary | ICD-10-CM

## 2014-09-26 MED ORDER — AMLODIPINE BESYLATE 5 MG PO TABS: 5.0000 mg | ORAL_TABLET | Freq: Every day | ORAL | Status: DC

## 2014-09-26 NOTE — Progress Notes (Signed)
09/26/2014 [name]   14-Jul-1971  [id number]  Primary Physician Rebecca Heck, MD Primary Cardiologist: Rebecca Harp MD [name]   HPI:  The patient returns today for followup. She is a 43 year old, moderately overweight, divorced Caucasian female, mother of 1 whose mother is a patient As was her father before he died  She has a history of hypertension, hyperlipidemia and positive family history for heart disease. She had a CT angiogram at [facility] which revealed mild to moderate CAD with a calcium score of 130. She does complain of chest pain and shortness of breath periodically. She had a Myoview stress test performed in our office October 02, 2012, which was entirely normal. Since I saw her back a year ago she's developed aggressive dyspnea and substernal chest pressure. She saw [name] [facility] in the office 09/07/14 who ordered a Myoview stress test that was done in 09/22/14  revealing mild ischemia in the LAD territory versus breast attenuation artifact.Based on this, I'm planning on performing outpatient cardiac catheterization to define her anatomy  Current Outpatient Prescriptions  Medication Sig Dispense Refill  . aspirin 81 MG EC tablet Take 81 mg by mouth daily.    Marland Kitchen atenolol (TENORMIN) 25 MG tablet Take 1 tablet (25 mg total) by mouth at bedtime. 90 tablet 3  . atorvastatin (LIPITOR) 40 MG tablet Take 40 mg by mouth daily.    Marland Kitchen BIOTIN 5000 PO Take 1 tablet by mouth daily.    . Calcium Carb-Cholecalciferol (CALCIUM 1000 + D PO) Take 1 tablet by mouth daily.    Marland Kitchen etonogestrel-ethinyl estradiol (NUVARING) 0.12-0.015 MG/24HR vaginal ring Place 1 each vaginally every 28 (twenty-eight) days. Insert vaginally and leave in place for 3 consecutive weeks, then remove for 1 week.    . levothyroxine (SYNTHROID, LEVOTHROID) 25 MCG tablet Take 25 mcg by mouth daily.  0  . meloxicam (MOBIC) 15 MG tablet Take 15 mg by mouth daily as needed for pain.     .  methocarbamol (ROBAXIN) 750 MG tablet     . Multiple Vitamin (MULTIVITAMIN) capsule Take 1 capsule by mouth daily.    Marland Kitchen omeprazole (PRILOSEC) 20 MG capsule Take 20 mg by mouth daily.    . sertraline (ZOLOFT) 100 MG tablet Take 100 mg by mouth daily.     No current facility-administered medications for this visit.    No Known Allergies  History   Social History  . Marital Status: Divorced    Spouse Name: N/A    Number of Children: N/A  . Years of Education: N/A   Occupational History  . Not on file.   Social History Main Topics  . Smoking status: Never Smoker   . Smokeless tobacco: Never Used  . Alcohol Use: No  . Drug Use: No  . Sexual Activity: Yes    Birth Control/ Protection: Pill, Inserts   Other Topics Concern  . Not on file   Social History Narrative     Review of Systems: General: negative for chills, fever, night sweats or weight changes.  Cardiovascular: negative for chest pain, dyspnea on exertion, edema, orthopnea, palpitations, paroxysmal nocturnal dyspnea or shortness of breath Dermatological: negative for rash Respiratory: negative for cough or wheezing Urologic: negative for hematuria Abdominal: negative for nausea, vomiting, diarrhea, bright red blood per rectum, melena, or hematemesis Neurologic: negative for visual changes, syncope, or dizziness All other systems reviewed and are otherwise negative except as noted above.    Blood pressure 160/85, pulse 83, height  5\' 3"  (1.6 m), weight 250 lb 4.8 oz (113.535 kg).  General appearance: alert and no distress Neck: no adenopathy, no carotid bruit, no JVD, supple, symmetrical, trachea midline and thyroid not enlarged, symmetric, no tenderness/mass/nodules Lungs: clear to auscultation bilaterally Heart: regular rate and rhythm, S1, S2 normal, no murmur, click, rub or gallop Extremities: extremities normal, atraumatic, no cyanosis or edema  EKG not performed today  ASSESSMENT AND PLAN:   Chest pain  with moderate risk of acute coronary syndrome [name] has been having substernal chest pressure. She had a recent Myoview stress test performed 09/22/14 that showed mild ischemia in the LAD territory versus breast attenuation artifact. She has gained 50 pounds last year Factors include strong family history for heart disease, hypertension and hyperlipidemia. Based on her symptoms, risk factor profile and stress test abnormality which is new compared to prior stress test. Decided to proceed with outpatient cardiac catheterization via right radial approach. I thoroughly explained the risks and benefits to the patient and her family.  Essential hypertension Her blood pressure measured in the office today is 160/85. She is on atenolol. We will need to adjust her antihypertensive medications after her cardiac catheterization.  Hyperlipidemia History of hyperlipidemia on atorvastatin 40 mg a day with her most recent lipid profile performed a year ago related to pressure was 64, LDL 79 and HDL of [place] MD [facility] 09/26/2014 4:46 PM

## 2014-09-26 NOTE — Assessment & Plan Note (Signed)
History of hyperlipidemia on atorvastatin 40 mg a day with her most recent lipid profile performed a year ago related to pressure was 64, LDL 79 and HDL of 44

## 2014-09-26 NOTE — Assessment & Plan Note (Signed)
Her blood pressure measured in the office today is 160/85. She is on atenolol. We will need to adjust her antihypertensive medications after her cardiac catheterization.

## 2014-09-26 NOTE — Patient Instructions (Addendum)
Start Amlodipine 5 MG Once Daily.  Your physician has requested that you have a cardiac catheterization (RIGHT RADIAL). Cardiac catheterization is used to diagnose and/or treat various heart conditions. Doctors may recommend this procedure for a number of different reasons. The most common reason is to evaluate chest pain. Chest pain can be a symptom of coronary artery disease (CAD), and cardiac catheterization can show whether plaque is narrowing or blocking your heart's arteries. This procedure is also used to evaluate the valves, as well as measure the blood flow and oxygen levels in different parts of your heart. For further information please visit [email or web address].   Following your catheterization, you will not be allowed to drive for 3 days.  No lifting, pushing, or pulling greater that 10 pounds is allowed for 1 week.  You will be required to have the following tests prior to the procedure:  1. Blood work-the blood work can be done no more than 7 days prior to the procedure.  It can be done at any [facility] lab.  There is one downstairs on the first floor of this building and one in the [place] ([address])  2. Chest Xray-the chest xray order has already been placed at the [facility].

## 2014-09-26 NOTE — Assessment & Plan Note (Signed)
[name] has been having substernal chest pressure. She had a recent Myoview stress test performed 09/22/14 that showed mild ischemia in the LAD territory versus breast attenuation artifact. She has gained 50 pounds last year Factors include strong family history for heart disease, hypertension and hyperlipidemia. Based on her symptoms, risk factor profile and stress test abnormality which is new compared to prior stress test. Decided to proceed with outpatient cardiac catheterization via right radial approach. I thoroughly explained the risks and benefits to the patient and her family.

## 2014-09-27 ENCOUNTER — Ambulatory Visit
Admission: RE | Admit: 2014-09-27 | Discharge: 2014-09-27 | Disposition: A | Discharge status: Home / Self Care | Payer: BC Managed Care – PPO | Source: Ambulatory Visit

## 2014-09-27 ENCOUNTER — Encounter: Payer: Self-pay | Admitting: Cardiovascular Disease

## 2014-09-27 DIAGNOSIS — Z9889 Other specified postprocedural states: Secondary | ICD-10-CM

## 2014-09-27 DIAGNOSIS — Z1231 Encounter for screening mammogram for malignant neoplasm of breast: Secondary | ICD-10-CM

## 2014-09-27 LAB — BASIC METABOLIC PANEL
BUN: 11 mg/dL (ref 6–23)
CO2: 25 mEq/L (ref 19–32)
Calcium: 9.9 mg/dL (ref 8.4–10.5)
Chloride: 103 mEq/L (ref 96–112)
Creat: 0.74 mg/dL (ref 0.50–1.10)
Glucose, Bld: 82 mg/dL (ref 70–99)
Potassium: 4.5 mEq/L (ref 3.5–5.3)
Sodium: 137 mEq/L (ref 135–145)

## 2014-09-27 LAB — CBC
HCT: 39.8 % (ref 36.0–46.0)
Hemoglobin: 13.7 g/dL (ref 12.0–15.0)
MCH: 29.3 pg (ref 26.0–34.0)
MCHC: 34.4 g/dL (ref 30.0–36.0)
MCV: 85 fL (ref 78.0–100.0)
MPV: 9.6 fL (ref 9.4–12.4)
PLATELETS: 356 10*3/uL (ref 150–400)
RBC: 4.68 MIL/uL (ref 3.87–5.11)
RDW: 13.8 % (ref 11.5–15.5)
WBC: 12.7 10*3/uL — ABNORMAL HIGH (ref 4.0–10.5)

## 2014-09-27 LAB — APTT: APTT: 26 s (ref 24–37)

## 2014-09-27 LAB — PROTIME-INR
INR: 0.88 (ref <1.50)
Prothrombin Time: 11.9 seconds (ref 11.6–15.2)

## 2014-09-28 ENCOUNTER — Telehealth: Payer: Self-pay | Admitting: *Deleted

## 2014-09-28 NOTE — Telephone Encounter (Signed)
ERROR

## 2014-09-29 ENCOUNTER — Ambulatory Visit
Admission: RE | Admit: 2014-09-29 | Discharge: 2014-09-29 | Disposition: A | Discharge status: Home / Self Care | Payer: BC Managed Care – PPO | Source: Ambulatory Visit | Attending: Cardiovascular Disease | Admitting: Cardiovascular Disease

## 2014-09-29 DIAGNOSIS — Z01818 Encounter for other preprocedural examination: Secondary | ICD-10-CM

## 2014-09-29 DIAGNOSIS — I25119 Atherosclerotic heart disease of native coronary artery with unspecified angina pectoris: Secondary | ICD-10-CM

## 2014-10-03 ENCOUNTER — Ambulatory Visit (HOSPITAL_COMMUNITY)
Admission: RE | Admit: 2014-10-03 | Discharge: 2014-10-03 | Disposition: A | Discharge status: Home / Self Care | Payer: BC Managed Care – PPO | Source: Ambulatory Visit | Attending: Cardiovascular Disease | Admitting: Cardiovascular Disease

## 2014-10-03 ENCOUNTER — Encounter (HOSPITAL_COMMUNITY): Payer: Self-pay | Admitting: Cardiovascular Disease

## 2014-10-03 ENCOUNTER — Encounter (HOSPITAL_COMMUNITY)
Admission: RE | Disposition: A | Discharge status: Home / Self Care | Payer: Self-pay | Source: Ambulatory Visit | Attending: Cardiovascular Disease

## 2014-10-03 DIAGNOSIS — E785 Hyperlipidemia, unspecified: Secondary | ICD-10-CM | POA: Diagnosis not present

## 2014-10-03 DIAGNOSIS — R9439 Abnormal result of other cardiovascular function study: Secondary | ICD-10-CM

## 2014-10-03 DIAGNOSIS — Z7982 Long term (current) use of aspirin: Secondary | ICD-10-CM | POA: Diagnosis not present

## 2014-10-03 DIAGNOSIS — R079 Chest pain, unspecified: Secondary | ICD-10-CM | POA: Diagnosis present

## 2014-10-03 DIAGNOSIS — I2510 Atherosclerotic heart disease of native coronary artery without angina pectoris: Secondary | ICD-10-CM | POA: Diagnosis not present

## 2014-10-03 DIAGNOSIS — D689 Coagulation defect, unspecified: Secondary | ICD-10-CM

## 2014-10-03 DIAGNOSIS — I10 Essential (primary) hypertension: Secondary | ICD-10-CM | POA: Diagnosis not present

## 2014-10-03 DIAGNOSIS — Z01818 Encounter for other preprocedural examination: Secondary | ICD-10-CM

## 2014-10-03 DIAGNOSIS — I25119 Atherosclerotic heart disease of native coronary artery with unspecified angina pectoris: Secondary | ICD-10-CM

## 2014-10-03 HISTORY — PX: LEFT HEART CATHETERIZATION WITH CORONARY ANGIOGRAM: SHX5451

## 2014-10-03 LAB — PREGNANCY, URINE: Preg Test, Ur: NEGATIVE

## 2014-10-03 SURGERY — LEFT HEART CATHETERIZATION WITH CORONARY ANGIOGRAM
Anesthesia: LOCAL

## 2014-10-03 MED ORDER — MIDAZOLAM HCL 2 MG/2ML IJ SOLN
INTRAMUSCULAR | Status: AC
  Filled 2014-10-03: qty 2

## 2014-10-03 MED ORDER — ONDANSETRON HCL 4 MG/2ML IJ SOLN: 4.0000 mg | Freq: Four times a day (QID) | INTRAMUSCULAR | Status: DC | PRN

## 2014-10-03 MED ORDER — LIDOCAINE HCL (PF) 1 % IJ SOLN
INTRAMUSCULAR | Status: AC
  Filled 2014-10-03: qty 30

## 2014-10-03 MED ORDER — HEPARIN SODIUM (PORCINE) 1000 UNIT/ML IJ SOLN
INTRAMUSCULAR | Status: AC
  Filled 2014-10-03: qty 1

## 2014-10-03 MED ORDER — ACETAMINOPHEN 325 MG PO TABS: 650.0000 mg | ORAL_TABLET | ORAL | Status: DC | PRN

## 2014-10-03 MED ORDER — NITROGLYCERIN 1 MG/10 ML FOR IR/CATH LAB
INTRA_ARTERIAL | Status: AC
  Filled 2014-10-03: qty 10

## 2014-10-03 MED ORDER — VERAPAMIL HCL 2.5 MG/ML IV SOLN
INTRAVENOUS | Status: AC
  Filled 2014-10-03: qty 2

## 2014-10-03 MED ORDER — SODIUM CHLORIDE 0.9 % IV SOLN: INTRAVENOUS | Status: AC

## 2014-10-03 MED ORDER — SODIUM CHLORIDE 0.9 % IJ SOLN: 3.0000 mL | INTRAMUSCULAR | Status: DC | PRN

## 2014-10-03 MED ORDER — ASPIRIN 81 MG PO CHEW
81.0000 mg | CHEWABLE_TABLET | ORAL | Status: AC
  Administered 2014-10-03: 81 mg via ORAL

## 2014-10-03 MED ORDER — HEPARIN (PORCINE) IN NACL 2-0.9 UNIT/ML-% IJ SOLN
INTRAMUSCULAR | Status: AC
  Filled 2014-10-03: qty 1000

## 2014-10-03 MED ORDER — FENTANYL CITRATE 0.05 MG/ML IJ SOLN
INTRAMUSCULAR | Status: AC
  Filled 2014-10-03: qty 2

## 2014-10-03 MED ORDER — ASPIRIN 81 MG PO CHEW: 81.0000 mg | CHEWABLE_TABLET | Freq: Every day | ORAL | Status: DC

## 2014-10-03 MED ORDER — ASPIRIN 81 MG PO CHEW
CHEWABLE_TABLET | ORAL | Status: AC
  Filled 2014-10-03: qty 1

## 2014-10-03 MED ORDER — SODIUM CHLORIDE 0.9 % IV SOLN
INTRAVENOUS | Status: DC
  Administered 2014-10-03: 10:00:00 via INTRAVENOUS

## 2014-10-03 MED ORDER — SODIUM CHLORIDE 0.9 % IV SOLN: INTRAVENOUS | Status: DC

## 2014-10-03 NOTE — Interval H&P Note (Signed)
Cath Lab Visit (complete for each Cath Lab visit)  Clinical Evaluation Leading to the Procedure:   ACS: No.  Non-ACS:    Anginal Classification: CCS III  Anti-ischemic medical therapy: Minimal Therapy (1 class of medications)  Non-Invasive Test Results: High-risk stress test findings: cardiac mortality >3%/year  Prior CABG: No previous CABG      History and Physical Interval Note:  10/03/2014 10:58 AM  [name]  has presented today for surgery, with the diagnosis of cad / abnormal stress test  The various methods of treatment have been discussed with the patient and family. After consideration of risks, benefits and other options for treatment, the patient has consented to  Procedure(s): LEFT HEART CATHETERIZATION WITH CORONARY ANGIOGRAM (N/A) as a surgical intervention .  The patient's history has been reviewed, patient examined, no change in status, stable for surgery.  I have reviewed the patient's chart and labs.  Questions were answered to the patient's satisfaction.     Lorretta Harp

## 2014-10-03 NOTE — Discharge Instructions (Signed)
Radial Site Care °Refer to this sheet in the next few weeks. These instructions provide you with information on caring for yourself after your procedure. Your caregiver may also give you more specific instructions. Your treatment has been planned according to current medical practices, but problems sometimes occur. Call your caregiver if you have any problems or questions after your procedure. °HOME CARE INSTRUCTIONS °· You may shower the day after the procedure. Remove the bandage (dressing) and gently wash the site with plain soap and water. Gently pat the site dry. °· Do not apply powder or lotion to the site. °· Do not submerge the affected site in water for 3 to 5 days. °· Inspect the site at least twice daily. °· Do not flex or bend the affected arm for 24 hours. °· No lifting over 5 pounds (2.3 kg) for 5 days after your procedure. °· Do not drive home if you are discharged the same day of the procedure. Have someone else drive you. °· You may drive 24 hours after the procedure unless otherwise instructed by your caregiver. °· Do not operate machinery or power tools for 24 hours. °· A responsible adult should be with you for the first 24 hours after you arrive home. °What to expect: °· Any bruising will usually fade within 1 to 2 weeks. °· Blood that collects in the tissue (hematoma) may be painful to the touch. It should usually decrease in size and tenderness within 1 to 2 weeks. °SEEK IMMEDIATE MEDICAL CARE IF: °· You have unusual pain at the radial site. °· You have redness, warmth, swelling, or pain at the radial site. °· You have drainage (other than a small amount of blood on the dressing). °· You have chills. °· You have a fever or persistent symptoms for more than 72 hours. °· You have a fever and your symptoms suddenly get worse. °· Your arm becomes pale, cool, tingly, or numb. °· You have heavy bleeding from the site. Hold pressure on the site. °Document Released: 11/09/2010 Document Revised:  12/30/2011 Document Reviewed: 11/09/2010 °ExitCare® Patient Information ©2015 [employer]. This information is not intended to replace advice given to you by your health care provider. Make sure you discuss any questions you have with your health care provider. ° °

## 2014-10-03 NOTE — CV Procedure (Signed)
[name] is a 43 y.o. female    [id number] LOCATION:  FACILITY: [facility]  PHYSICIAN: Quay Burow, M.D. [birth year]   DATE OF PROCEDURE:  10/03/2014  DATE OF DISCHARGE:     CARDIAC CATHETERIZATION     History obtained from chart review.The patient returns today for followup. She is a 43 year old, moderately overweight, divorced Caucasian female, mother of 1 whose mother is a patient As was her father before he died She has a history of hypertension, hyperlipidemia and positive family history for heart disease. She had a CT angiogram at [facility] which revealed mild to moderate CAD with a calcium score of 130. She does complain of chest pain and shortness of breath periodically. She had a Myoview stress test performed in our office October 02, 2012, which was entirely normal. Since I saw her back a year ago she's developed aggressive dyspnea and substernal chest pressure. She saw [name] [facility] in the office 09/07/14 who ordered a Myoview stress test that was done in 09/22/14 revealing mild ischemia in the LAD territory versus breast attenuation artifact.Based on this, I'm planning on performing outpatient cardiac catheterization to define her anatomy   PROCEDURE DESCRIPTION:   The patient was brought to the second floor [facility]iac cath lab in the postabsorptive state. She was premedicated with Valium 5 mg by mouth, IV Versed and fentanyl.  Her right wrist was prepped and shaved in usual sterile fashion. Xylocaine 1% was used for local anesthesia. A 6 French sheath was inserted into the right radial artery using standard Seldinger technique. The patient received 5000 units  of heparin  intravenously.  Using a 5 [place] TIG catheter and EBU 3 catheter along with pigtail catheter selective coronary angiography and left ventricular performed. Visipaque dye was used for the entirety of the case. Retrograde aortic, left ventricular and pullback pressures were recorded.    HEMODYNAMICS:      AO SYSTOLIC/AO DIASTOLIC: 025/85   LV SYSTOLIC/LV DIASTOLIC: 277/82   ANGIOGRAPHIC RESULTS:    1. Left main; normal 2. LAD; normal 3. Left circumflex; nondominant and normal.  4. Right coronary artery; dominant and normal 5. Left ventriculography; RAO left ventriculogram was performed using  25 mL of Visipaque dye at 12 mL/second. The overall LVEF estimated  60 %  Without wall motion abnormalities  IMPRESSION:[name] has essentially normal coronary arteries and normal left ventricular function. I believe her stress test is false positive and her symptoms noncardiac. Her sheath was removed and a TR band was placed on the right wrist to achieve patent hemostasis. The patient left the lab in stable condition. She'll be discharged home and outpatient and will see back in the clinic in 2-3 weeks for follow-up.  Lorretta Harp MD, [facility] 10/03/2014 11:43 AM

## 2014-10-03 NOTE — H&P (View-Only) (Signed)
09/26/2014 [name]   25-Apr-1971  [id number]  Primary Physician Gerrit Heck, MD Primary Cardiologist: Lorretta Harp MD [name]   HPI:  The patient returns today for followup. She is a 43 year old, moderately overweight, divorced Caucasian female, mother of 1 whose mother is a patient As was her father before he died  She has a history of hypertension, hyperlipidemia and positive family history for heart disease. She had a CT angiogram at [facility] which revealed mild to moderate CAD with a calcium score of 130. She does complain of chest pain and shortness of breath periodically. She had a Myoview stress test performed in our office October 02, 2012, which was entirely normal. Since I saw her back a year ago she's developed aggressive dyspnea and substernal chest pressure. She saw [name] [facility] in the office 09/07/14 who ordered a Myoview stress test that was done in 09/22/14  revealing mild ischemia in the LAD territory versus breast attenuation artifact.Based on this, I'm planning on performing outpatient cardiac catheterization to define her anatomy  Current Outpatient Prescriptions  Medication Sig Dispense Refill  . aspirin 81 MG EC tablet Take 81 mg by mouth daily.    Marland Kitchen atenolol (TENORMIN) 25 MG tablet Take 1 tablet (25 mg total) by mouth at bedtime. 90 tablet 3  . atorvastatin (LIPITOR) 40 MG tablet Take 40 mg by mouth daily.    Marland Kitchen BIOTIN 5000 PO Take 1 tablet by mouth daily.    . Calcium Carb-Cholecalciferol (CALCIUM 1000 + D PO) Take 1 tablet by mouth daily.    Marland Kitchen etonogestrel-ethinyl estradiol (NUVARING) 0.12-0.015 MG/24HR vaginal ring Place 1 each vaginally every 28 (twenty-eight) days. Insert vaginally and leave in place for 3 consecutive weeks, then remove for 1 week.    . levothyroxine (SYNTHROID, LEVOTHROID) 25 MCG tablet Take 25 mcg by mouth daily.  0  . meloxicam (MOBIC) 15 MG tablet Take 15 mg by mouth daily as needed for pain.     .  methocarbamol (ROBAXIN) 750 MG tablet     . Multiple Vitamin (MULTIVITAMIN) capsule Take 1 capsule by mouth daily.    Marland Kitchen omeprazole (PRILOSEC) 20 MG capsule Take 20 mg by mouth daily.    . sertraline (ZOLOFT) 100 MG tablet Take 100 mg by mouth daily.     No current facility-administered medications for this visit.    No Known Allergies  History   Social History  . Marital Status: Divorced    Spouse Name: N/A    Number of Children: N/A  . Years of Education: N/A   Occupational History  . Not on file.   Social History Main Topics  . Smoking status: Never Smoker   . Smokeless tobacco: Never Used  . Alcohol Use: No  . Drug Use: No  . Sexual Activity: Yes    Birth Control/ Protection: Pill, Inserts   Other Topics Concern  . Not on file   Social History Narrative     Review of Systems: General: negative for chills, fever, night sweats or weight changes.  Cardiovascular: negative for chest pain, dyspnea on exertion, edema, orthopnea, palpitations, paroxysmal nocturnal dyspnea or shortness of breath Dermatological: negative for rash Respiratory: negative for cough or wheezing Urologic: negative for hematuria Abdominal: negative for nausea, vomiting, diarrhea, bright red blood per rectum, melena, or hematemesis Neurologic: negative for visual changes, syncope, or dizziness All other systems reviewed and are otherwise negative except as noted above.    Blood pressure 160/85, pulse 83, height  5\' 3"  (1.6 m), weight 250 lb 4.8 oz (113.535 kg).  General appearance: alert and no distress Neck: no adenopathy, no carotid bruit, no JVD, supple, symmetrical, trachea midline and thyroid not enlarged, symmetric, no tenderness/mass/nodules Lungs: clear to auscultation bilaterally Heart: regular rate and rhythm, S1, S2 normal, no murmur, click, rub or gallop Extremities: extremities normal, atraumatic, no cyanosis or edema  EKG not performed today  ASSESSMENT AND PLAN:   Chest pain  with moderate risk of acute coronary syndrome [name] has been having substernal chest pressure. She had a recent Myoview stress test performed 09/22/14 that showed mild ischemia in the LAD territory versus breast attenuation artifact. She has gained 50 pounds last year Factors include strong family history for heart disease, hypertension and hyperlipidemia. Based on her symptoms, risk factor profile and stress test abnormality which is new compared to prior stress test. Decided to proceed with outpatient cardiac catheterization via right radial approach. I thoroughly explained the risks and benefits to the patient and her family.  Essential hypertension Her blood pressure measured in the office today is 160/85. She is on atenolol. We will need to adjust her antihypertensive medications after her cardiac catheterization.  Hyperlipidemia History of hyperlipidemia on atorvastatin 40 mg a day with her most recent lipid profile performed a year ago related to pressure was 64, LDL 79 and HDL of [place] MD [facility] [place], [facility] 09/26/2014 4:46 PM

## 2014-10-04 ENCOUNTER — Ambulatory Visit: Payer: BC Managed Care – PPO | Admitting: Cardiovascular Disease

## 2014-10-05 ENCOUNTER — Telehealth: Payer: Self-pay | Admitting: Cardiovascular Disease

## 2014-10-05 NOTE — Telephone Encounter (Signed)
Pt had  A Cath on Monday,wrist is throbbing and in a lot of pain.Please call to advise.If you can not reach her on this number,please [phone].

## 2014-10-05 NOTE — Telephone Encounter (Signed)
Patient calling c/o throbbing pain right wrist at cath site.  No bruising, redness or oozing and no radiation.  No tingling of finger tips or discoloration of hand.  She tried an old pain pill last PM and tylenol with no relief.  Discussed with Tarri Fuller, PA states it is probably a nerve causing the discomfort and to use a cold compress at the site and call Friday AM if no relief. Patient voiced understanding.

## 2014-10-17 ENCOUNTER — Other Ambulatory Visit: Payer: Self-pay | Admitting: Physician Assistant

## 2014-10-17 DIAGNOSIS — K219 Gastro-esophageal reflux disease without esophagitis: Secondary | ICD-10-CM

## 2014-10-17 DIAGNOSIS — R4702 Dysphasia: Secondary | ICD-10-CM

## 2014-10-20 ENCOUNTER — Ambulatory Visit
Admission: RE | Admit: 2014-10-20 | Discharge: 2014-10-20 | Disposition: A | Discharge status: Home / Self Care | Payer: BC Managed Care – PPO | Source: Ambulatory Visit | Attending: Physician Assistant | Admitting: Physician Assistant

## 2014-10-20 DIAGNOSIS — K219 Gastro-esophageal reflux disease without esophagitis: Secondary | ICD-10-CM

## 2014-10-20 DIAGNOSIS — R4702 Dysphasia: Secondary | ICD-10-CM

## 2014-10-28 ENCOUNTER — Ambulatory Visit: Payer: BC Managed Care – PPO | Admitting: Cardiovascular Disease

## 2014-10-28 ENCOUNTER — Other Ambulatory Visit: Payer: BC Managed Care – PPO

## 2014-11-10 ENCOUNTER — Ambulatory Visit: Payer: BC Managed Care – PPO | Admitting: Internal Medicine

## 2014-11-14 ENCOUNTER — Other Ambulatory Visit: Payer: Self-pay | Admitting: *Deleted

## 2014-11-14 MED ORDER — ATORVASTATIN CALCIUM 40 MG PO TABS: 40.0000 mg | ORAL_TABLET | Freq: Every day | ORAL | Status: DC

## 2015-03-29 ENCOUNTER — Encounter: Payer: Self-pay | Admitting: *Deleted

## 2015-03-31 ENCOUNTER — Telehealth: Payer: Self-pay | Admitting: *Deleted

## 2015-03-31 NOTE — Telephone Encounter (Signed)
Fax received from patient asking Dr Gwenlyn Found to fill out the cardiology portion of some paperwork for her to start a residential obesity program.  Dr Gwenlyn Found filled out the form and it was faxed back to the patient per her request.

## 2015-04-06 ENCOUNTER — Ambulatory Visit: Payer: Self-pay

## 2015-04-07 IMAGING — RF DG ESOPHAGUS
11 of 13 series · 19 of 24 positions shown · non-contrast
Comparison: None.

CLINICAL DATA: Dysphagia, symptoms of reflux

EXAM:
ESOPHOGRAM / BARIUM SWALLOW / BARIUM TABLET STUDY
TECHNIQUE: Combined double contrast and single contrast examination performed
using effervescent crystals, thick barium liquid, and thin barium
liquid. The patient was observed with fluoroscopy swallowing a 13mm
barium sulphate tablet.
FLUOROSCOPY TIME:  1 min 36 second

[Series 2: run · 1 of 1 slices shown (1 of 11)]
[im 1/1]
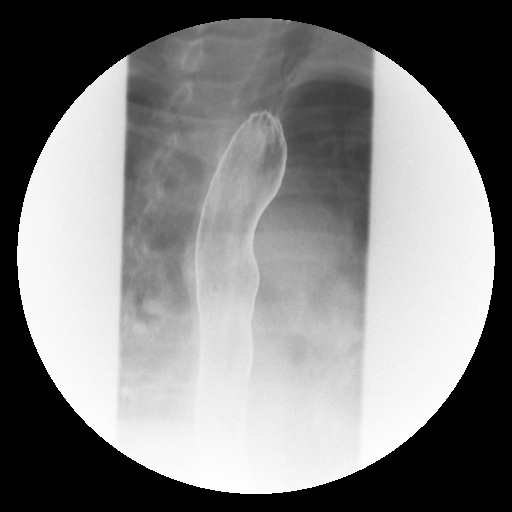

[Series 3: run · 1 of 1 slices shown (2 of 11)]
[im 1/1]
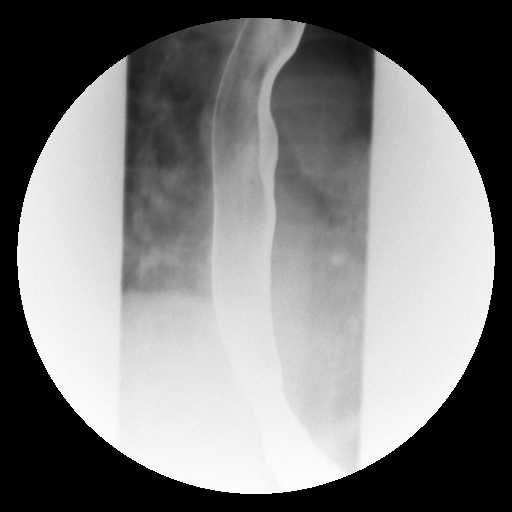

[Series 6: run · 1 of 1 slices shown (3 of 11)]
[im 1/1]
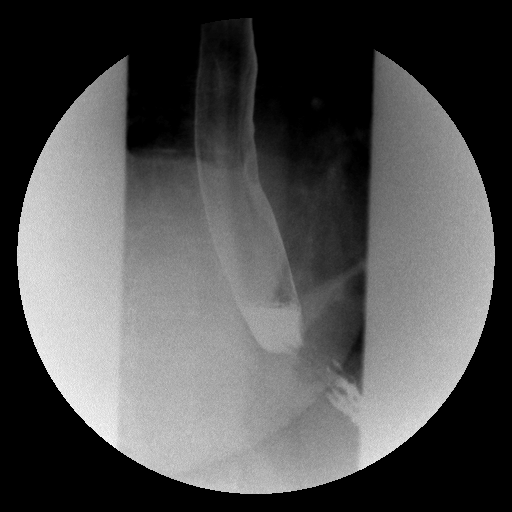

[Series 7: run · 1 of 1 slices shown (4 of 11)]
[im 1/1]
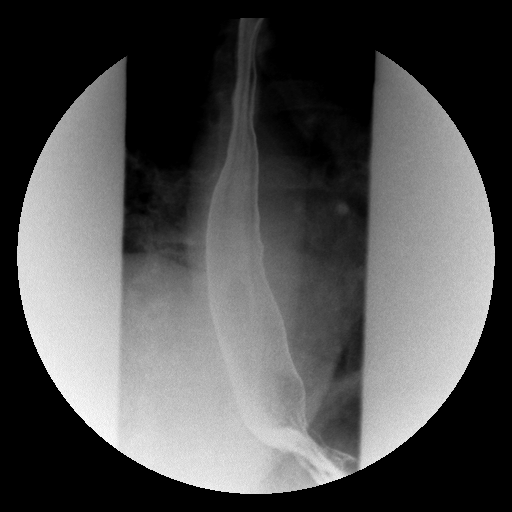

[Series 8: run · 5 of 9 slices shown (5 of 11)]
[im 1/9]
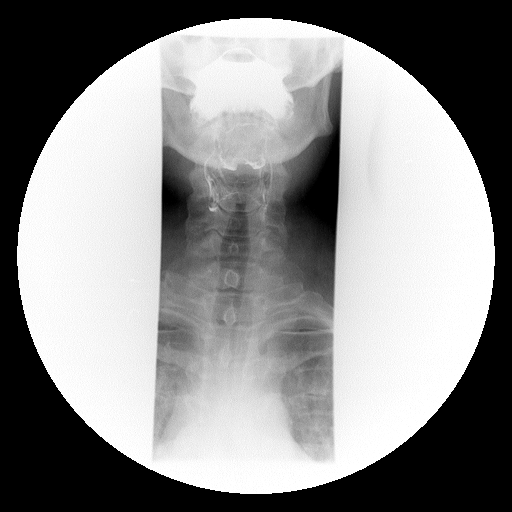
[im 2/9]
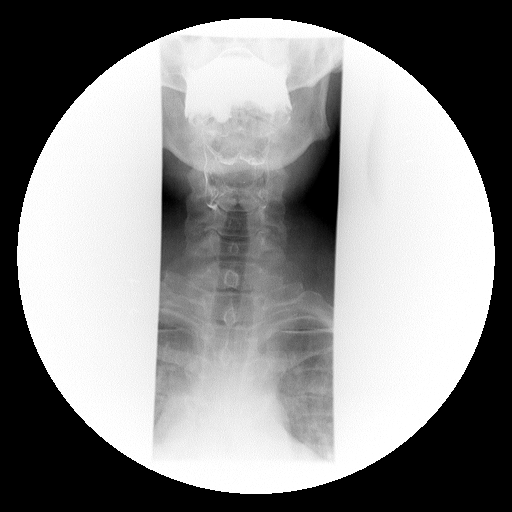
[im 5/9]
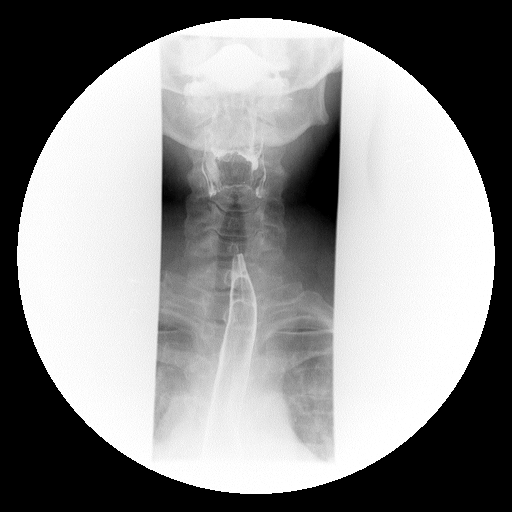
[im 6/9]
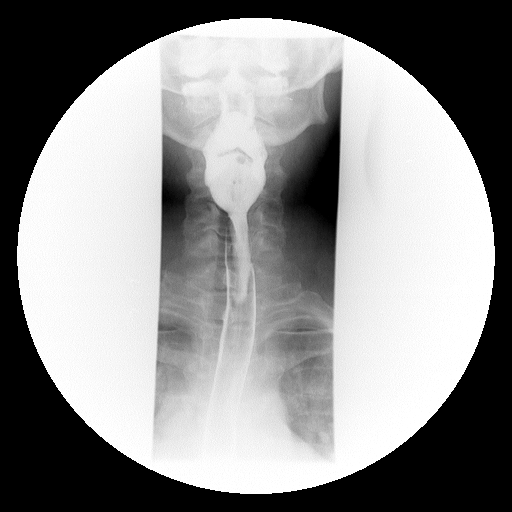
[im 7/9]
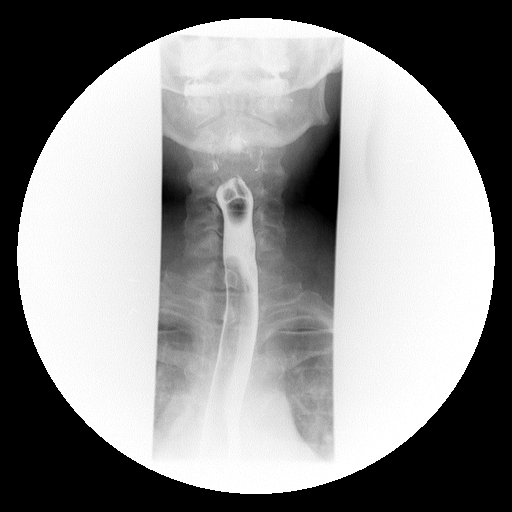

[Series 9: run · 5 of 8 slices shown (6 of 11)]
[im 1/8]
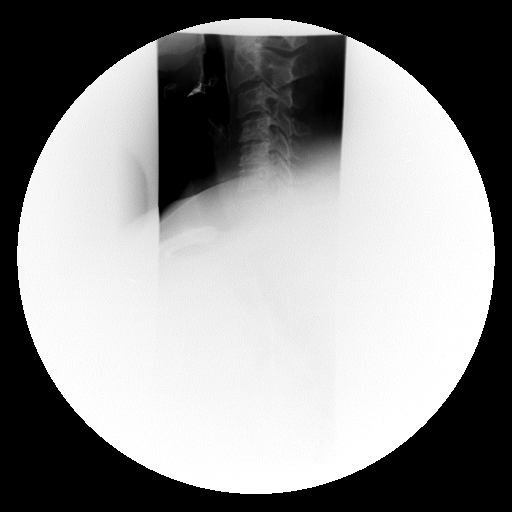
[im 2/8]
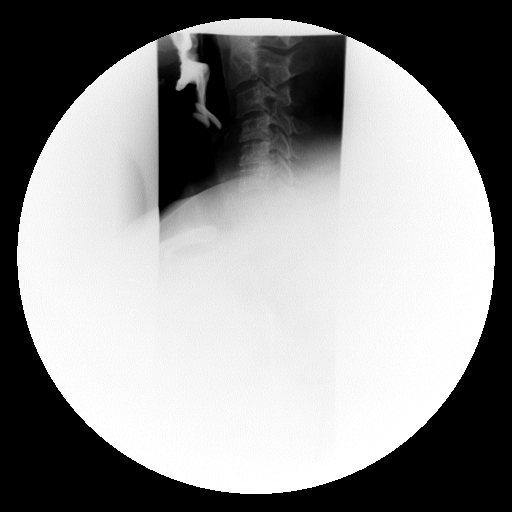
[im 3/8]
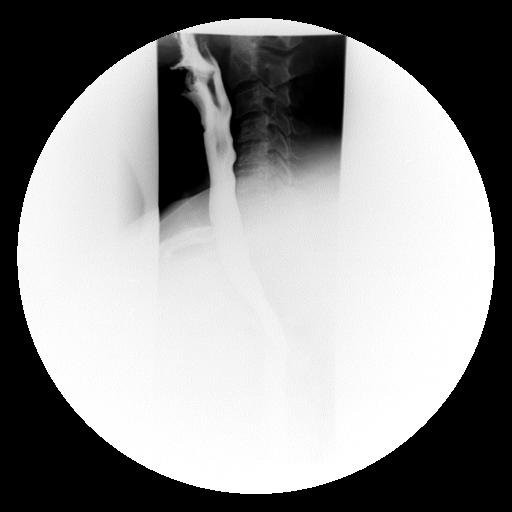
[im 5/8]
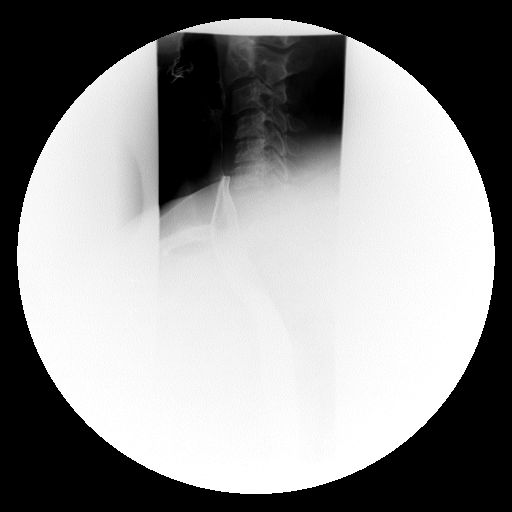
[im 8/8]
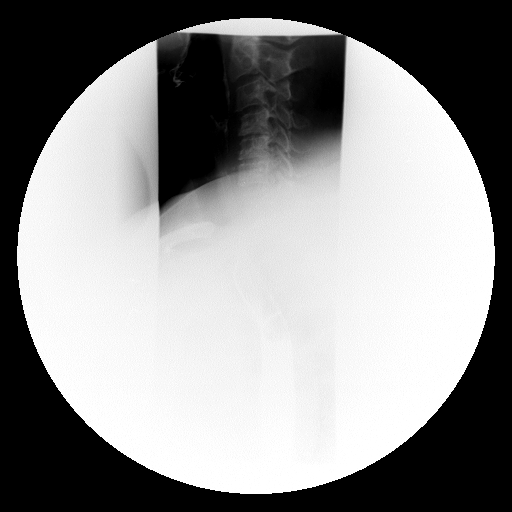

[Series 10: run · 1 of 1 slices shown (7 of 11)]
[im 1/1]
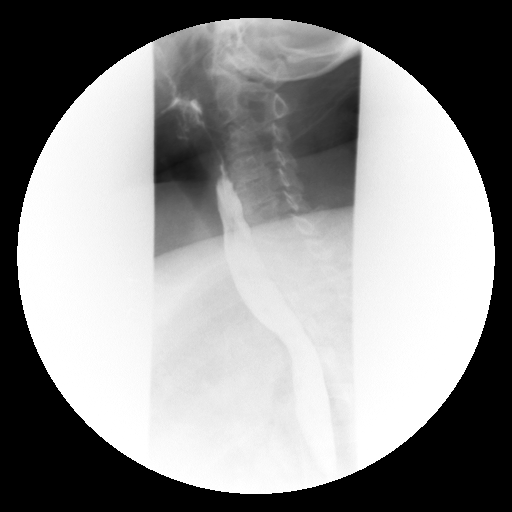

[Series 11: run · 1 of 1 slices shown (8 of 11)]
[im 1/1]
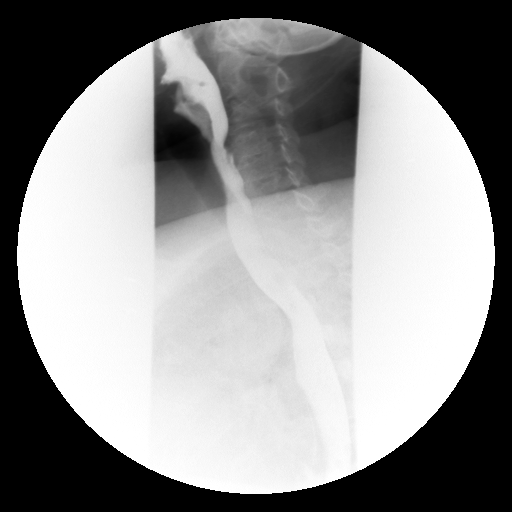

[Series 12: run · 1 of 1 slices shown (9 of 11)]
[im 1/1]
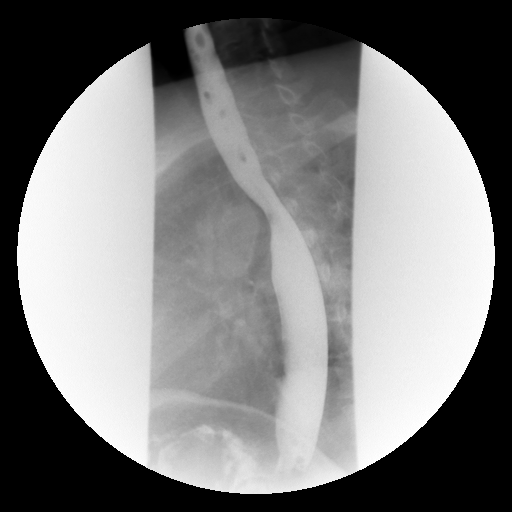

[Series 15: run · 1 of 1 slices shown (10 of 11)]
[im 1/1]
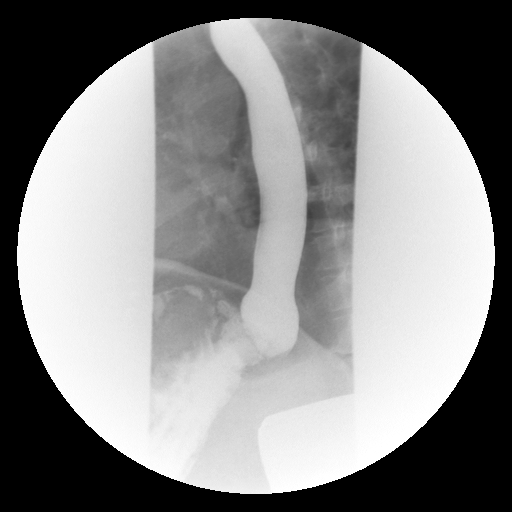

[Series 16: run · 1 of 1 slices shown (11 of 11)]
[im 1/1]
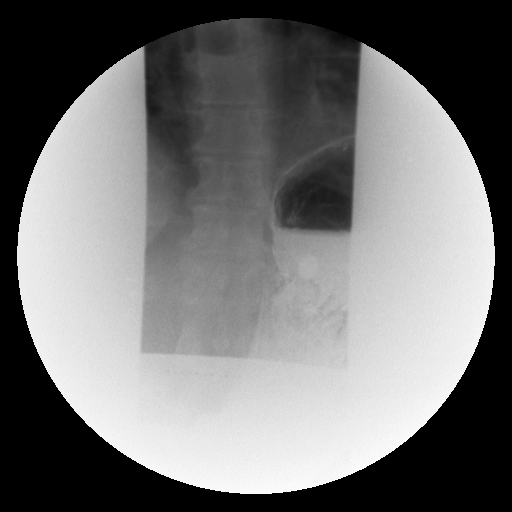

[19 of 24 positions shown; findings below may reference images not displayed]

FINDINGS: Initially double-contrast barium swallow was performed. The mucosa
of the esophagus is unremarkable. A single contrast study shows the
swallowing mechanism to be normal. Esophageal peristalsis is normal.
No hiatal hernia is seen. No gastroesophageal reflux could be
demonstrated. A barium pill was given at the end of the study which
did pass into the stomach without delay.
IMPRESSION: Negative barium swallow.

## 2015-04-13 ENCOUNTER — Ambulatory Visit: Payer: Self-pay

## 2015-04-20 ENCOUNTER — Ambulatory Visit: Payer: Self-pay

## 2015-04-25 ENCOUNTER — Other Ambulatory Visit: Payer: Self-pay | Admitting: Cardiovascular Disease

## 2015-04-25 NOTE — Telephone Encounter (Signed)
REFILL 

## 2015-06-17 ENCOUNTER — Other Ambulatory Visit: Payer: Self-pay | Admitting: Cardiovascular Disease

## 2015-06-19 NOTE — Telephone Encounter (Signed)
REFILL 

## 2015-06-27 ENCOUNTER — Other Ambulatory Visit: Payer: Self-pay | Admitting: Cardiovascular Disease

## 2015-06-29 ENCOUNTER — Telehealth: Payer: Self-pay | Admitting: Cardiovascular Disease

## 2015-06-29 MED ORDER — AMLODIPINE BESYLATE 5 MG PO TABS: 5.0000 mg | ORAL_TABLET | Freq: Every day | ORAL | Status: DC

## 2015-06-29 NOTE — Telephone Encounter (Signed)
Pt says she received her Norvasc and it was only for #15,she always get #30 with refills.She just saw Dr Gwenlyn Found in [place] needs the other 15 pills and refills please.

## 2015-06-29 NOTE — Telephone Encounter (Signed)
Rx(s) sent to pharmacy electronically.  

## 2015-07-07 ENCOUNTER — Telehealth: Payer: Self-pay | Admitting: Cardiovascular Disease

## 2015-07-07 MED ORDER — AMLODIPINE BESYLATE 5 MG PO TABS: 5.0000 mg | ORAL_TABLET | Freq: Every day | ORAL | Status: DC

## 2015-07-07 NOTE — Telephone Encounter (Signed)
Called patient. For Amlodipine - med refilled for 30 days w/ refills to [facility]. Pt aware, voiced thanks.

## 2015-07-07 NOTE — Telephone Encounter (Signed)
[name] is calling because when she went to get her medication it was only refilled for 15 pills. She is not due an appt until December . Wants to know why was it only refilled for 15 pills. Please call   Thanks

## 2015-08-20 ENCOUNTER — Other Ambulatory Visit: Payer: Self-pay | Admitting: Cardiovascular Disease

## 2015-08-21 ENCOUNTER — Other Ambulatory Visit: Payer: Self-pay

## 2015-08-21 DIAGNOSIS — Z1231 Encounter for screening mammogram for malignant neoplasm of breast: Secondary | ICD-10-CM

## 2015-08-25 ENCOUNTER — Other Ambulatory Visit: Payer: Self-pay | Admitting: Orthopedic Surgery

## 2015-08-25 DIAGNOSIS — M25561 Pain in right knee: Secondary | ICD-10-CM

## 2015-08-29 ENCOUNTER — Ambulatory Visit
Admission: RE | Admit: 2015-08-29 | Discharge: 2015-08-29 | Disposition: A | Discharge status: Home / Self Care | Payer: BLUE CROSS/BLUE SHIELD | Source: Ambulatory Visit

## 2015-08-29 DIAGNOSIS — Z1231 Encounter for screening mammogram for malignant neoplasm of breast: Secondary | ICD-10-CM

## 2015-09-03 ENCOUNTER — Ambulatory Visit
Admission: RE | Admit: 2015-09-03 | Discharge: 2015-09-03 | Disposition: A | Discharge status: Home / Self Care | Payer: BLUE CROSS/BLUE SHIELD | Source: Ambulatory Visit | Attending: Orthopedic Surgery | Admitting: Orthopedic Surgery

## 2015-09-03 DIAGNOSIS — M25561 Pain in right knee: Secondary | ICD-10-CM

## 2015-09-29 ENCOUNTER — Ambulatory Visit (INDEPENDENT_AMBULATORY_CARE_PROVIDER_SITE_OTHER): Payer: BLUE CROSS/BLUE SHIELD | Admitting: Cardiovascular Disease

## 2015-09-29 ENCOUNTER — Encounter: Payer: Self-pay | Admitting: Cardiovascular Disease

## 2015-09-29 VITALS — BP 124/78 | HR 66 | Ht 63.0 in | Wt 204.0 lb

## 2015-09-29 DIAGNOSIS — R079 Chest pain, unspecified: Secondary | ICD-10-CM | POA: Diagnosis not present

## 2015-09-29 DIAGNOSIS — I10 Essential (primary) hypertension: Secondary | ICD-10-CM | POA: Diagnosis not present

## 2015-09-29 DIAGNOSIS — E785 Hyperlipidemia, unspecified: Secondary | ICD-10-CM

## 2015-09-29 MED ORDER — AMLODIPINE BESYLATE 5 MG PO TABS: 5.0000 mg | ORAL_TABLET | Freq: Every day | ORAL | Status: DC

## 2015-09-29 MED ORDER — ATORVASTATIN CALCIUM 40 MG PO TABS: 40.0000 mg | ORAL_TABLET | Freq: Every day | ORAL | Status: DC

## 2015-09-29 MED ORDER — ATENOLOL 25 MG PO TABS
25.0000 mg | ORAL_TABLET | Freq: Every day | ORAL | Status: DC
Start: 2015-09-29 — End: 2016-10-21

## 2015-09-29 NOTE — Assessment & Plan Note (Signed)
History of hyperlipidemia on atorvastatin followed by her PCP 

## 2015-09-29 NOTE — Assessment & Plan Note (Signed)
History of gastric bypass surgery 07/18/15 with subsequent loss of approximately 50 pounds. She feels clinically improved.

## 2015-09-29 NOTE — Progress Notes (Signed)
09/29/2015 [name]   25-Mar-1971  [id number]  Primary Physician Gerrit Heck, MD Primary Cardiologist: Lorretta Harp MD [name]   HPI:  The patient returns today for followup. She is a 44 year old, moderately overweight, divorced Caucasian female, mother of 1 whose mother is a patient As was her father before he died 2014-04-26. She has a history of hypertension, hyperlipidemia and positive family history for heart disease. She had a CT angiogram at [facility] which revealed mild to moderate CAD with a calcium score of 130. She does complain of chest pain and shortness of breath periodically. She had a Myoview stress test performed in our office October 02, 2012, which was entirely normal. Since I saw her back a year ago she's developed aggressive dyspnea and substernal chest pressure. She saw [name] [facility] in the office 09/07/14 who ordered a Myoview stress test that was done in 09/22/14 revealing mild ischemia in the LAD territory versus breast attenuation artifact. I performed cardiac catheterization on her via the right radial approach 10/04/14 which was entirely normal. 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 She feels quickly improved.   Current Outpatient Prescriptions  Medication Sig Dispense Refill  . amLODipine (NORVASC) 5 MG tablet Take 1 tablet (5 mg total) by mouth daily. 90 tablet 3  . atenolol (TENORMIN) 25 MG tablet Take 1 tablet (25 mg total) by mouth at bedtime. 90 tablet 3  . atorvastatin (LIPITOR) 40 MG tablet Take 1 tablet (40 mg total) by mouth daily. 90 tablet 3  . BIOTIN 5000 PO Take 2 tablets by mouth daily.     Marland Kitchen etonogestrel-ethinyl estradiol (NUVARING) 0.12-0.015 MG/24HR vaginal ring Place 1 each vaginally every 28 (twenty-eight) days. Insert vaginally and leave in place for 3 consecutive weeks, then remove for 1 week.    . levothyroxine (SYNTHROID, LEVOTHROID) 25 MCG tablet Take 25 mcg by  mouth daily.  0  . Multiple Vitamin (MULTIVITAMIN) capsule Take 4 capsules by mouth daily.     Marland Kitchen omeprazole (PRILOSEC) 20 MG capsule Take 20 mg by mouth daily.    . Polyethylene Glycol 3350 (MIRALAX PO) Take by mouth daily.     No current facility-administered medications for this visit.    No Known Allergies  Social History   Social History  . Marital Status: Divorced    Spouse Name: N/A  . Number of Children: N/A  . Years of Education: N/A   Occupational History  . Not on file.   Social History Main Topics  . Smoking status: Never Smoker   . Smokeless tobacco: Never Used  . Alcohol Use: No  . Drug Use: No  . Sexual Activity: Yes    Birth Control/ Protection: Pill, Inserts   Other Topics Concern  . Not on file   Social History Narrative     Review of Systems: General: negative for chills, fever, night sweats or weight changes.  Cardiovascular: negative for chest pain, dyspnea on exertion, edema, orthopnea, palpitations, paroxysmal nocturnal dyspnea or shortness of breath Dermatological: negative for rash Respiratory: negative for cough or wheezing Urologic: negative for hematuria Abdominal: negative for nausea, vomiting, diarrhea, bright red blood per rectum, melena, or hematemesis Neurologic: negative for visual changes, syncope, or dizziness All other systems reviewed and are otherwise negative except as noted above.    Blood pressure 124/78, pulse 66, height 5\' 3"  (1.6 m), weight 204 lb (92.534 kg).  General appearance: alert and no distress Neck: no  adenopathy, no carotid bruit, no JVD, supple, symmetrical, trachea midline and thyroid not enlarged, symmetric, no tenderness/mass/nodules Lungs: clear to auscultation bilaterally Heart: regular rate and rhythm, S1, S2 normal, no murmur, click, rub or gallop Extremities: extremities normal, atraumatic, no cyanosis or edema  EKG normal sinus rhythm at 66 with poor R-wave progression. I personally reviewed this  EKG  ASSESSMENT AND PLAN:   Morbid obesity History of gastric bypass surgery 07/18/15 with subsequent loss of approximately 50 pounds. She feels clinically improved.  Hyperlipidemia History of hyperlipidemia on atorvastatin followed by her PCP  Essential hypertension History of hypertension blood pressure measures at 124/78. She is on atenolol and amlodipine. Continue current meds at current dosing  Chest pain with moderate risk of acute coronary syndrome History of cardiac catheterization performed by myself 10/04/14 which was normal. She no longer has chest pain or shortness of breath.      Lorretta Harp MD FACP,FACC,FAHA, [facility] 09/29/2015 11:41 AM

## 2015-09-29 NOTE — Assessment & Plan Note (Signed)
History of hypertension blood pressure measures at 124/78. She is on atenolol and amlodipine. Continue current meds at current dosing

## 2015-09-29 NOTE — Patient Instructions (Signed)

## 2015-09-29 NOTE — Assessment & Plan Note (Signed)
History of cardiac catheterization performed by myself 10/04/14 which was normal. She no longer has chest pain or shortness of breath.

## 2016-07-31 ENCOUNTER — Other Ambulatory Visit: Payer: Self-pay | Admitting: Family Medicine

## 2016-07-31 DIAGNOSIS — Z1231 Encounter for screening mammogram for malignant neoplasm of breast: Secondary | ICD-10-CM

## 2016-08-05 ENCOUNTER — Emergency Department (HOSPITAL_COMMUNITY): Payer: BLUE CROSS/BLUE SHIELD

## 2016-08-05 ENCOUNTER — Encounter (HOSPITAL_COMMUNITY): Payer: Self-pay | Admitting: Emergency Medicine

## 2016-08-05 ENCOUNTER — Emergency Department (HOSPITAL_COMMUNITY)
Admission: EM | Admit: 2016-08-05 | Discharge: 2016-08-05 | Disposition: A | Discharge status: Home / Self Care | Payer: BLUE CROSS/BLUE SHIELD | Attending: Emergency Medicine | Admitting: Emergency Medicine

## 2016-08-05 DIAGNOSIS — I2510 Atherosclerotic heart disease of native coronary artery without angina pectoris: Secondary | ICD-10-CM | POA: Diagnosis not present

## 2016-08-05 DIAGNOSIS — R1013 Epigastric pain: Secondary | ICD-10-CM

## 2016-08-05 DIAGNOSIS — E039 Hypothyroidism, unspecified: Secondary | ICD-10-CM | POA: Diagnosis not present

## 2016-08-05 DIAGNOSIS — R1010 Upper abdominal pain, unspecified: Secondary | ICD-10-CM | POA: Diagnosis present

## 2016-08-05 DIAGNOSIS — I10 Essential (primary) hypertension: Secondary | ICD-10-CM | POA: Diagnosis not present

## 2016-08-05 LAB — URINALYSIS, ROUTINE W REFLEX MICROSCOPIC
BILIRUBIN URINE: NEGATIVE
GLUCOSE, UA: NEGATIVE mg/dL
HGB URINE DIPSTICK: NEGATIVE
Ketones, ur: NEGATIVE mg/dL
Nitrite: NEGATIVE
PH: 7 (ref 5.0–8.0)
Protein, ur: NEGATIVE mg/dL
SPECIFIC GRAVITY, URINE: 1.017 (ref 1.005–1.030)

## 2016-08-05 LAB — CBC
HCT: 40.8 % (ref 36.0–46.0)
HEMOGLOBIN: 13.6 g/dL (ref 12.0–15.0)
MCH: 30.4 pg (ref 26.0–34.0)
MCHC: 33.3 g/dL (ref 30.0–36.0)
MCV: 91.3 fL (ref 78.0–100.0)
Platelets: 328 10*3/uL (ref 150–400)
RBC: 4.47 MIL/uL (ref 3.87–5.11)
RDW: 13.1 % (ref 11.5–15.5)
WBC: 11.5 10*3/uL — ABNORMAL HIGH (ref 4.0–10.5)

## 2016-08-05 LAB — URINE MICROSCOPIC-ADD ON: RBC / HPF: NONE SEEN RBC/hpf (ref 0–5)

## 2016-08-05 LAB — COMPREHENSIVE METABOLIC PANEL
ALBUMIN: 3.6 g/dL (ref 3.5–5.0)
ALK PHOS: 98 U/L (ref 38–126)
ALT: 40 U/L (ref 14–54)
ANION GAP: 8 (ref 5–15)
AST: 26 U/L (ref 15–41)
BUN: 9 mg/dL (ref 6–20)
CALCIUM: 9 mg/dL (ref 8.9–10.3)
CO2: 22 mmol/L (ref 22–32)
Chloride: 108 mmol/L (ref 101–111)
Creatinine, Ser: 0.96 mg/dL (ref 0.44–1.00)
GFR calc Af Amer: >60 mL/min (ref >60)
GFR calc non Af Amer: >60 mL/min (ref >60)
GLUCOSE: 104 mg/dL — AB (ref 65–99)
Potassium: 3.9 mmol/L (ref 3.5–5.1)
Sodium: 138 mmol/L (ref 135–145)
Total Bilirubin: 1.2 mg/dL (ref 0.3–1.2)
Total Protein: 6.6 g/dL (ref 6.5–8.1)

## 2016-08-05 LAB — I-STAT BETA HCG BLOOD, ED (MC, WL, AP ONLY)

## 2016-08-05 LAB — LIPASE, BLOOD: Lipase: 22 U/L (ref 11–51)

## 2016-08-05 MED ORDER — IOPAMIDOL (ISOVUE-300) INJECTION 61%
INTRAVENOUS | Status: AC
  Administered 2016-08-05: 100 mL
  Filled 2016-08-05: qty 100

## 2016-08-05 MED ORDER — SUCRALFATE 1 G PO TABS: 1.0000 g | ORAL_TABLET | Freq: Three times a day (TID) | ORAL | 1 refills | Status: DC

## 2016-08-05 NOTE — Discharge Instructions (Signed)
Your CT scan did not show any abnormalities in your abdomen to explain your pain. 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 Continue to take your Prilosec. Take Carafate as prescribed. Follow up with gastroenterology. Return if fever, vomiting, any new concerning symptoms.

## 2016-08-05 NOTE — ED Triage Notes (Signed)
Pt states she has been having abd pain for three days. Pt had gastric bypass a year ago and a stomach ulcer a year ago. Pt states abd is tender to touch and worse when she eats. Denies N/V/D

## 2016-08-05 NOTE — ED Provider Notes (Signed)
[facility] DEPT Provider Note   CSN: [id number] Arrival date & time: 08/05/16  [id number]     History   Chief Complaint Chief Complaint  Patient presents with  . Abdominal Pain    HPI [name] is a 45 y.o. female.  HPI [name] is a 45 y.o. female with history of gastric bypass, gastric ulcers, hypertension, obesity, presents to emergency room complaining of upper abdominal pain. Patient states her symptoms started 2 days ago. She states pain is constant, comes and goes in severity, at this time she rates it as 2 out of 10. She denies any associated nausea or vomiting. She denies any changes in her bowels, last bowel movement 2 days ago. She denies any diarrhea. No blood in her stool or emesis. Denies black or tarry stools. Denies any back pain. She is currently taking Prilosec, she has not tried any other treatment. Denies fever or chills. No other complaints.  Past Medical History:  Diagnosis Date  . Abnormal Pap smear   . 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 Chest pain at rest, with strong family history of CAD. 07/27/2012  . High cholesterol   . Hyperlipidemia 07/27/2012  . Hypertension   . Morbid obesity (HCC)    BMI 44  . Stomach ulcer     Patient Active Problem List   Diagnosis Date Noted  . Family history of coronary artery disease 09/07/2014  . DOE (dyspnea on exertion) 09/07/2014  . Hypothyroid 09/07/2014  . Back pain 09/07/2014  . Excessive sweating 08/04/2014  . Essential hypertension 10/18/2013  . 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 Chest pain with moderate risk of acute coronary syndrome 07/27/2012  . Hyperlipidemia 07/27/2012  . Menopausal symptom 02/06/2012  . Fatigue 02/06/2012  . Morbid obesity ([place]) 02/06/2012  . Bariatric surgery status 02/06/2012    Past Surgical History:  Procedure Laterality Date  . CARDIOVASCULAR STRESS TEST   10/01/2012   No significant ST segment change suggestive of ischemia.  Marland Kitchen CARPAL TUNNEL RELEASE     2001  . CHOLECYSTECTOMY     2003  . COSMETIC SURGERY     2009  . FOOT SURGERY     2004  . gastic bypass    . HERNIA REPAIR     2008  . LEFT HEART CATHETERIZATION WITH CORONARY ANGIOGRAM N/A 10/03/2014   Procedure: LEFT HEART CATHETERIZATION WITH CORONARY ANGIOGRAM;  Surgeon: Lorretta Harp, MD;  Location: [facility] CATH LAB;  Service: Cardiovascular;  Laterality: N/A;  . TRANSTHORACIC ECHOCARDIOGRAM  04/24/2010   EF >55%, normal LV systolic function    OB History    Gravida Para Term Preterm AB Living   1 1 1  0 0 1   SAB TAB Ectopic Multiple Live Births   0 0 0 0 1       Home Medications    Prior to Admission medications   Medication Sig Start Date End Date Taking? Authorizing Provider  amLODipine (NORVASC) 5 MG tablet Take 1 tablet (5 mg total) by mouth daily. 09/29/15   Lorretta Harp, MD  atenolol (TENORMIN) 25 MG tablet Take 1 tablet (25 mg total) by mouth at bedtime. 09/29/15   Lorretta Harp, MD  atorvastatin (LIPITOR) 40 MG tablet Take 1 tablet (40 mg total) by mouth daily. 09/29/15   Lorretta Harp, MD  BIOTIN 5000 PO Take 2 tablets by mouth daily.  Historical Provider, MD  etonogestrel-ethinyl estradiol (NUVARING) 0.12-0.015 MG/24HR vaginal ring Place 1 each vaginally every 28 (twenty-eight) days. Insert vaginally and leave in place for 3 consecutive weeks, then remove for 1 week.    Historical Provider, MD  levothyroxine (SYNTHROID, LEVOTHROID) 25 MCG tablet Take 25 mcg by mouth daily. 09/01/14   Historical Provider, MD  Multiple Vitamin (MULTIVITAMIN) capsule Take 4 capsules by mouth daily.     Historical Provider, MD  omeprazole (PRILOSEC) 20 MG capsule Take 20 mg by mouth daily.    Historical Provider, MD  Polyethylene Glycol 3350 (MIRALAX PO) Take by mouth daily.    Historical Provider, MD  sucralfate (CARAFATE) 1 g tablet Take 1 tablet (1 g total) by mouth 4 (four)  times daily -  with meals and at bedtime. 08/05/16   Jeannett Senior, PA-C    Family History Family History  Problem Relation Age of Onset  . Diabetes Paternal Grandfather   . Breast cancer Paternal Grandmother   . Breast cancer Maternal Grandmother   . Diabetes Father   . Coronary artery disease Father   . Coronary artery disease Mother     Social History Social History  Substance Use Topics  . Smoking status: Never Smoker  . Smokeless tobacco: Never Used  . Alcohol use No     Allergies   Review of patient's allergies indicates no known allergies.   Review of Systems Review of Systems  Constitutional: Negative for chills and fever.  Respiratory: Negative for cough, chest tightness and shortness of breath.   Cardiovascular: Negative for chest pain, palpitations and leg swelling.  Gastrointestinal: Positive for abdominal pain. Negative for diarrhea, nausea and vomiting.  Genitourinary: Negative for dysuria, flank pain, pelvic pain, vaginal bleeding, vaginal discharge and vaginal pain.  Musculoskeletal: Negative for arthralgias, myalgias, neck pain and neck stiffness.  Skin: Negative for rash.  Neurological: Negative for dizziness, weakness and headaches.  All other systems reviewed and are negative.    Physical Exam Updated Vital Signs BP 118/79   Pulse 80   Temp 98.5 F (36.9 C) (Oral)   Resp (!) 27   Ht 5\' 3"  (1.6 m)   Wt (!) 582 kg   SpO2 97%   BMI 227.27 kg/m   Physical Exam  Constitutional: She appears well-developed and well-nourished. No distress.  HENT:  Head: Normocephalic.  Eyes: Conjunctivae are normal.  Neck: Neck supple.  Cardiovascular: Normal rate, regular rhythm and normal heart sounds.   Pulmonary/Chest: Effort normal and breath sounds normal. No respiratory distress. She has no wheezes. She has no rales.  Abdominal: Soft. Bowel sounds are normal. She exhibits no distension. There is tenderness. There is no rebound and no guarding.    Epigastric tenderness  Musculoskeletal: She exhibits no edema.  Neurological: She is alert.  Skin: Skin is warm and dry.  Psychiatric: She has a normal mood and affect. Her behavior is normal.  Nursing note and vitals reviewed.    ED Treatments / Results  Labs (all labs ordered are listed, but only abnormal results are displayed) Labs Reviewed  COMPREHENSIVE METABOLIC PANEL - Abnormal; Notable for the following:       Result Value   Glucose, Bld 104 (*)    All other components within normal limits  CBC - Abnormal; Notable for the following:    WBC 11.5 (*)    All other components within normal limits  URINALYSIS, ROUTINE W REFLEX MICROSCOPIC (NOT AT [facility]) - Abnormal; Notable for the following:  Color, Urine AMBER (*)    APPearance CLOUDY (*)    Leukocytes, UA MODERATE (*)    All other components within normal limits  URINE MICROSCOPIC-ADD ON - Abnormal; Notable for the following:    Squamous Epithelial / LPF 6-30 (*)    Bacteria, UA FEW (*)    All other components within normal limits  LIPASE, BLOOD  I-STAT BETA HCG BLOOD, ED ([facility], [facility], [facility] ONLY)    EKG  EKG Interpretation None       Radiology Ct Abdomen Pelvis W Contrast  Result Date: 08/05/2016 CLINICAL DATA:  45 year old female with 3 days of abdominal pain. History of gastric bypass surgery in September 2016. Patient also has a history of gastric ulcers. EXAM: CT ABDOMEN AND PELVIS WITH CONTRAST TECHNIQUE: Multidetector CT imaging of the abdomen and pelvis was performed using the standard protocol following bolus administration of intravenous contrast. CONTRAST:  141mL ISOVUE-300 IOPAMIDOL (ISOVUE-300) INJECTION 61% COMPARISON:  Prior CT scan of the abdomen and pelvis 06/28/2014 FINDINGS: Lower chest: The lung bases are clear. Visualized cardiac structures are within normal limits for size. No pericardial effusion. Unremarkable visualized distal thoracic esophagus. Hepatobiliary: Normal hepatic contour and morphology.  No discrete hepatic lesions. The gallbladder is surgically absent. No intra or extrahepatic biliary ductal dilatation. Pancreas: Unremarkable. No pancreatic ductal dilatation or surrounding inflammatory changes. Spleen: Normal in size without focal abnormality. Adrenals/Urinary Tract: Adrenal glands are unremarkable. Kidneys are normal, without renal calculi, focal lesion, or hydronephrosis. Bladder is unremarkable. Stomach/Bowel: Surgical changes of prior gastric bypass. The anastomoses appear unremarkable. No evidence of obstruction, focal wall thickening, or inflammatory change. No free fluid. Vascular/Lymphatic: Trace aortic atherosclerotic calcifications. No aneurysm. No suspicious lymphadenopathy. Reproductive: Uterus and bilateral adnexa are unremarkable. Nuva Ring noted incidentally. Other: No abdominal wall hernia or abnormality. No abdominopelvic ascites. Musculoskeletal: No acute or significant osseous findings. Focal degenerative disc disease at L3-L4. IMPRESSION: 1. No findings to explain the patient's clinical symptoms. 2. Surgical changes of gastric bypass procedure without CT evidence of complication. 3. Focal degenerative disc disease at L3-L4. Electronically Signed   By: Jacqulynn Cadet M.D.   On: 08/05/2016 16:08    Procedures Procedures (including critical care time)  Medications Ordered in ED Medications  iopamidol (ISOVUE-300) 61 % injection (100 mLs  Contrast Given 08/05/16 1540)     Initial Impression / Assessment and Plan / ED Course  I have reviewed the triage vital signs and the nursing notes.  Pertinent labs & imaging results that were available during my care of the patient were reviewed by me and considered in my medical decision making (see chart for details).  Clinical Course    Patient with history of gastric bypass one year ago followed by diagnosis of gastric ulcers, here with abdominal pain, symptoms for 3 days, no nausea or vomiting, no diarrhea, no  constipation, no dark or tarry stools, no blood in her stools or emesis. Reports history of also abdominal hernia, states feels similar to that. She is afebrile, nontoxic appearing. Rates pain as 2 out of 10. No peritoneal signs and exam. Will check labs, CT abdomen and pelvis.  4:53 PM Labs unremarkable except for mildly elevated white blood cell count of 11.5. Urinalysis with moderate leukocytes, however sample was contaminated. Patient CT scan is unremarkable. She has history of cholecystectomy. She is not vomiting. VS normal. Plan to dc home with close outpatient follow up with GI and surgery. Will add carafate to prilosec she is on. Return precuations discussed.  Vitals:   08/05/16 1250 08/05/16 1448 08/05/16 1500 08/05/16 1658  BP: 111/78 118/80 118/79 116/73  Pulse: 84 73 80 81  Resp: 17 15 (!) 27 15  Temp: 98.7 F (37.1 C) 98.5 F (36.9 C)  98.3 F (36.8 C)  TempSrc: Oral Oral  Oral  SpO2: 94% 97% 97% 98%  Weight:      Height:          Final Clinical Impressions(s) / ED Diagnoses   Final diagnoses:  Epigastric pain    New Prescriptions New Prescriptions   SUCRALFATE (CARAFATE) 1 G TABLET    Take 1 tablet (1 g total) by mouth 4 (four) times daily -  with meals and at bedtime.     Jeannett Senior, PA-C 08/06/16 [place], MD 08/06/16 1911

## 2016-08-05 NOTE — ED Notes (Signed)
Patient transported to CT 

## 2016-08-29 ENCOUNTER — Ambulatory Visit
Admission: RE | Admit: 2016-08-29 | Discharge: 2016-08-29 | Disposition: A | Discharge status: Home / Self Care | Payer: BLUE CROSS/BLUE SHIELD | Source: Ambulatory Visit | Attending: Family Medicine | Admitting: Family Medicine

## 2016-08-29 DIAGNOSIS — Z1231 Encounter for screening mammogram for malignant neoplasm of breast: Secondary | ICD-10-CM

## 2016-09-17 ENCOUNTER — Telehealth (INDEPENDENT_AMBULATORY_CARE_PROVIDER_SITE_OTHER): Payer: Self-pay | Admitting: Physical Medicine and Rehabilitation

## 2016-09-17 NOTE — Telephone Encounter (Signed)
Attempted to call patient to schedule. No voicemail option. Will try again.

## 2016-09-17 NOTE — Telephone Encounter (Signed)
Yes ok 

## 2016-09-18 NOTE — Telephone Encounter (Signed)
Active bcbs coverage and no precert required

## 2016-10-02 ENCOUNTER — Ambulatory Visit (INDEPENDENT_AMBULATORY_CARE_PROVIDER_SITE_OTHER): Payer: Self-pay | Admitting: Physical Medicine and Rehabilitation

## 2016-10-02 ENCOUNTER — Encounter (INDEPENDENT_AMBULATORY_CARE_PROVIDER_SITE_OTHER): Payer: Self-pay | Admitting: Physical Medicine and Rehabilitation

## 2016-10-02 VITALS — BP 125/71

## 2016-10-02 DIAGNOSIS — M5116 Intervertebral disc disorders with radiculopathy, lumbar region: Secondary | ICD-10-CM | POA: Diagnosis not present

## 2016-10-02 MED ORDER — LIDOCAINE HCL (PF) 1 % IJ SOLN
0.3300 mL | Freq: Once | INTRAMUSCULAR | Status: AC
  Administered 2016-10-02: 0.3 mL

## 2016-10-02 MED ORDER — METHYLPREDNISOLONE ACETATE 80 MG/ML IJ SUSP
80.0000 mg | Freq: Once | INTRAMUSCULAR | Status: AC
  Administered 2016-10-02: 80 mg

## 2016-10-02 NOTE — Procedures (Signed)
Lumbar Epidural Steroid Injection - Interlaminar Approach with Fluoroscopic Guidance  Patient: [name]      Date of Birth: 1971-06-13 MRN: [id number] PCP: Gerrit Heck, MD      Visit Date: 10/02/2016   Universal Protocol:    Date/Time: 12/13/171:09 PM  Consent Given By: the patient  Position: PRONE  Additional Comments: Vital signs were monitored before and after the procedure. Patient was prepped and draped in the usual sterile fashion. The correct patient, procedure, and site was verified.   Injection Procedure Details:  Procedure Site One Meds Administered:  Meds ordered this encounter  Medications  . lidocaine (PF) (XYLOCAINE) 1 % injection 0.3 mL  . methylPREDNISolone acetate (DEPO-MEDROL) injection 80 mg     Laterality: Right  Location/Site:  L4-L5  Needle size: 20 G  Needle type: Tuohy  Needle Placement: Paramedian epidural  Findings:  -Contrast Used: 1 mL iohexol 180 mg iodine/mL   -Comments: Excellent flow of contrast into the epidural space.  Procedure Details: Using a paramedian approach from the side mentioned above, the region overlying the inferior lamina was localized under fluoroscopic visualization and the soft tissues overlying this structure were infiltrated with 4 ml. of 1% Lidocaine without Epinephrine. The Tuohy needle was inserted into the epidural space using a paramedian approach.   The epidural space was localized using loss of resistance along with lateral and bi-planar fluoroscopic views.  After negative aspirate for air, blood, and CSF, a 2 ml. volume of Isovue-250 was injected into the epidural space and the flow of contrast was observed. Radiographs were obtained for documentation purposes.    The injectate was administered into the level noted above.   Additional Comments:  The patient tolerated the procedure well Dressing: Band-Aid    Post-procedure details: Patient was observed during the  procedure. Post-procedure instructions were reviewed.  Patient left the clinic in stable condition.

## 2016-10-02 NOTE — Progress Notes (Signed)
[name] - 45 y.o. female MRN [id number]  Date of birth: [birth year]  Office Visit Note: Visit Date: 10/02/2016 PCP: Gerrit Heck, MD Referred by: Leighton Ruff, MD  Subjective: Chief Complaint  Patient presents with  . Lower Back - Pain   HPI: [name] is a pleasant 45 year old female who I haven't seen her in quite a while but we did complete an intralaminar epidural injection on 2 occasions at L4-5 for what is in L3-4 disc herniation with some lateral recess narrowing and right hip and leg pain. Did well with last injection. Not pain free but it helpedquite a bit. She reports that she started taking tumeric in March and from March to October she had no pain. Pain returned in October "with a vengeance." she reports no specific trauma although she did have a period where she was moving boxes and doing some work. Was still taking tumeric but stopped taking last week. Right sided into buttock and hip. No radiating pain down leg. No pain with laying. Increased pain with walking and standing any length of time. Pulling sensation when she bends. She has tried anti-inflammatories as well with no relief. She has been trying to modify her activities. She says really is affecting her at this point pticularly at night and with trying to work. She is on her feet a lot. Her history is well documented in the prior system that we're using but briefly she was seeing Dr. Laverta Baltimore at [facility] for orthopedics and he has completed an L3 transforaminal injection was not beneficial and then went down the road of different diagnostic injections and she just wasn't getting much in the way of relief. She actually had seen Dr. Marcello Moores daily as well. We reviewed her case and decided that it probably still was the disc herniation that was problematic and we completed an intralaminar injection with good success.        Review of Systems  Constitutional: Negative for chills, fever, malaise/fatigue and weight  loss.  HENT: Negative for hearing loss and sinus pain.   Eyes: Negative for blurred vision, double vision and photophobia.  Respiratory: Negative for cough and shortness of breath.   Cardiovascular: Negative for chest pain, palpitations and leg swelling.  Gastrointestinal: Negative for abdominal pain, nausea and vomiting.  Genitourinary: Negative for flank pain.  Musculoskeletal: Positive for back pain. Negative for myalgias.  Skin: Negative for itching and rash.  Neurological: Negative for tremors, focal weakness and weakness.  Endo/Heme/Allergies: Negative.   Psychiatric/Behavioral: Negative for depression.  All other systems reviewed and are negative.  Otherwise per HPI.  Assessment & Plan: Visit Diagnoses:  1. Radiculopathy due to lumbar intervertebral disc disorder     Plan: Findings:  Chronic and worsening right low back and upper buttock and lateral hip pain. This is still consistent with radicular pain even though it doesn't go down the leg. It's worse with sitting and better with movement were somewhat with standing. S has a little bit of faceme mild traumat she thinks may have exacerbated but this does not seem like anything new to me at least from an analytical standp. She's had no focal weakness no real red flag symptoms. I think at this point is oms which she g a diagnostic standpoint reinject at L4-5. We will do that today. Otherwise she can continue to take the tumeric and we talked  About that at length. We also talked about core strengthening exercises.    Meds & Orders:  Meds ordered this  encounter  Medications  . lidocaine (PF) (XYLOCAINE) 1 % injection 0.3 mL  . methylPREDNISolone acetate (DEPO-MEDROL) injection 80 mg    Orders Placed This Encounter  Procedures  . Epidural Steroid injection    Follow-up: Return if symptoms worsen or fail to improve, 2 weeks.   Procedures: No procedures performed  Lumbar Epidural Steroid Injection - Interlaminar Approach with  Fluoroscopic Guidance  Patient: [name]      Date of Birth: [birth year] MRN: [id number] PCP: Gerrit Heck, MD      Visit Date: 10/02/2016   Universal Protocol:    Date/Time: 12/13/171:09 PM  Consent Given By: the patient  Position: PRONE  Additional Comments: Vital signs were monitored before and after the procedure. Patient was prepped and draped in the usual sterile fashion. The correct patient, procedure, and site was verified.   Injection Procedure Details:  Procedure Site One Meds Administered:  Meds ordered this encounter  Medications  . lidocaine (PF) (XYLOCAINE) 1 % injection 0.3 mL  . methylPREDNISolone acetate (DEPO-MEDROL) injection 80 mg     Laterality: Right  Location/Site:  L4-L5  Needle size: 20 G  Needle type: Tuohy  Needle Placement: Paramedian epidural  Findings:  -Contrast Used: 1 mL iohexol 180 mg iodine/mL   -Comments: Excellent flow of contrast into the epidural space.  Procedure Details: Using a paramedian approach from the side mentioned above, the region overlying the inferior lamina was localized under fluoroscopic visualization and the soft tissues overlying this structure were infiltrated with 4 ml. of 1% Lidocaine without Epinephrine. The Tuohy needle was inserted into the epidural space using a paramedian approach.   The epidural space was localized using loss of resistance along with lateral and bi-planar fluoroscopic views.  After negative aspirate for air, blood, and CSF, a 2 ml. volume of Isovue-250 was injected into the epidural space and the flow of contrast was observed. Radiographs were obtained for documentation purposes.    The injectate was administered into the level noted above.   Additional Comments:  The patient tolerated the procedure well Dressing: Band-Aid    Post-procedure details: Patient was observed during the procedure. Post-procedure instructions were reviewed.  Patient left the clinic  in stable condition.       Clinical History: No specialty comments available.  She reports that she has never smoked. She has never used smokeless tobacco. No results for input(s): HGBA1C, LABURIC in the last 8760 hours.  Objective:  VS:  HT:    WT:   BMI:     BP:125/71  HR: bpm  TEMP: ( )  RESP:  Physical Exam  Constitutional: She is oriented to person, place, and time. She appears well-developed and well-nourished.  Eyes: Conjunctivae and EOM are normal. Pupils are equal, round, and reactive to light.  Cardiovascular: Normal rate and intact distal pulses.   Pulmonary/Chest: Effort normal.  Musculoskeletal:  The patient ambulates without aid wiormal gait. She has no pain with hip rotation. She has concordant pain with extension rotation of the lumbne. She has a slightly posi on the right with tight hamstring. She has good distal strength and no clonus.  Neurological: She is alert and oriented to person, place, and time.  Skin: Skin is warm and dry. No rash noted. No erythema.  Psychiatric: She has a normal mood and affect. Her behavior is normal.  Nursing note and vitals reviewed.   Ortho Exam Imaging: No results found.  Past Medical/Family/Surgical/Social History: Medications & Allergies reviewed per EMR Patient Active Problem List  Diagnosis Date Noted  . Radiculopathy due to lumbar intervertebral disc disorder 10/02/2016  . Family history of coronary artery disease 09/07/2014  . DOE (dyspnea on exertion) 09/07/2014  . Hypothyroid 09/07/2014  . Back pain 09/07/2014  . Excessive sweating 08/04/2014  . Essential hypertension 10/18/2013  . 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 Chest pain with moderate risk of acute coronary syndrome 07/27/2012  . Hyperlipidemia 07/27/2012  . Menopausal symptom 02/06/2012  . Fatigue 02/06/2012  . Morbid obesity ([place]) 02/06/2012  . Bariatric surgery status 02/06/2012   Past Medical History:    Diagnosis Date  . Abnormal Pap smear   . 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 Chest pain at rest, with strong family history of CAD. 07/27/2012  . High cholesterol   . Hyperlipidemia 07/27/2012  . Hypertension   . Morbid obesity ([facility])    BMI 44  . Stomach ulcer    Family History  Problem Relation Age of Onset  . Diabetes Paternal Grandfather   . Breast cancer Paternal Grandmother   . Breast cancer Maternal Grandmother   . Diabetes Father   . Coronary artery disease Father   . Coronary artery disease Mother    Past Surgical History:  Procedure Laterality Date  . CARDIOVASCULAR STRESS TEST  10/01/2012   No significant ST segment change suggestive of ischemia.  Marland Kitchen CARPAL TUNNEL RELEASE     2001  . CHOLECYSTECTOMY     2003  . COSMETIC SURGERY     2009  . FOOT SURGERY     2004  . gastic bypass    . HERNIA REPAIR     2008  . LEFT HEART CATHETERIZATION WITH CORONARY ANGIOGRAM N/A 10/03/2014   Procedure: LEFT HEART CATHETERIZATION WITH CORONARY ANGIOGRAM;  Surgeon: Lorretta Harp, MD;  Location: [facility] CATH LAB;  Service: Cardiovascular;  Laterality: N/A;  . TRANSTHORACIC ECHOCARDIOGRAM  04/24/2010   EF >55%, normal LV systolic function   Social History   Occupational History  . Not on file.   Social History Main Topics  . Smoking status: Never Smoker  . Smokeless tobacco: Never Used  . Alcohol use No  . Drug use: No  . Sexual activity: Yes    Birth control/ protection: Pill, Inserts

## 2016-10-02 NOTE — Patient Instructions (Signed)

## 2016-10-04 ENCOUNTER — Telehealth (INDEPENDENT_AMBULATORY_CARE_PROVIDER_SITE_OTHER): Payer: Self-pay | Admitting: Physical Medicine and Rehabilitation

## 2016-10-04 ENCOUNTER — Ambulatory Visit (INDEPENDENT_AMBULATORY_CARE_PROVIDER_SITE_OTHER): Payer: BLUE CROSS/BLUE SHIELD | Admitting: Cardiovascular Disease

## 2016-10-04 VITALS — BP 126/72 | HR 78 | Ht 63.0 in | Wt 190.0 lb

## 2016-10-04 DIAGNOSIS — E785 Hyperlipidemia, unspecified: Secondary | ICD-10-CM

## 2016-10-04 DIAGNOSIS — I10 Essential (primary) hypertension: Secondary | ICD-10-CM | POA: Diagnosis not present

## 2016-10-04 DIAGNOSIS — M62830 Muscle spasm of back: Secondary | ICD-10-CM

## 2016-10-04 MED ORDER — BACLOFEN 10 MG PO TABS: 10.0000 mg | ORAL_TABLET | Freq: Three times a day (TID) | ORAL | 0 refills | Status: DC | PRN

## 2016-10-04 NOTE — Assessment & Plan Note (Signed)
History of hyperlipidemia on statin therapy. We will recheck a lipid and liver profile 

## 2016-10-04 NOTE — Patient Instructions (Addendum)
Medication Instructions: STOP amlodipine.   Labwork: Your physician recommends that you return for a FASTING lipid profile and hepatic function panel.   Follow-Up: Your physician recommends that you schedule a follow-up appointment in: 1 month with Pharmacy for BP check   Your physician wants you to follow-up in: 1 year with Dr. Andria Rhein will receive a reminder letter in the mail two months in advance. If you don't receive a letter, please call our office to schedule the follow-up appointment.   Any Other Special Instructions will be listed below:  Your physician has requested that you regularly monitor and record your blood pressure readings at home for 1 month. Please use the same machine at the same time of day to check your readings and record them to bring to your follow-up visit.   If you need a refill on your cardiac medications before your next appointment, please call your pharmacy.

## 2016-10-04 NOTE — Progress Notes (Signed)
10/04/2016 [name]   [birth year]  [id number]  Primary Physician Gerrit Heck, MD Primary Cardiologist: Lorretta Harp MD [name]  HPI:  The patient returns today for followup. She is a 45 year old, moderately overweight, divorced Caucasian female, mother of 1 whose mother is a patient As was her father before he died 27-May-2014. I last saw her in the office 09/29/15. She has a history of hypertension, hyperlipidemia and positive family history for heart disease. She had a CT angiogram at [facility] which revealed mild to moderate CAD with a calcium score of 130. She does complain of chest pain and shortness of breath periodically. She had a Myoview stress test performed in our office October 02, 2012, which was entirely normal. Since I saw her back a year ago she's developed aggressive dyspnea and substernal chest pressure. She saw [name] [facility] in the office 09/07/14 who ordered a Myoview stress test that was done in 09/22/14 revealing mild ischemia in the LAD territory versus breast attenuation artifact. I performed cardiac catheterization on her via the right radial approach 10/04/14 which was entirely normal. 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 Since I saw her in the office a year ago she denies chest pain or shortness of breath. She has expressed a desire to come off her blood pressure medicines and her statin drugs. I told her that she can stop her amlodipine and we will recheck blood pressure measurements.   Current Outpatient Prescriptions  Medication Sig Dispense Refill  . atenolol (TENORMIN) 25 MG tablet Take 1 tablet (25 mg total) by mouth at bedtime. 90 tablet 3  . atorvastatin (LIPITOR) 40 MG tablet Take 1 tablet (40 mg total) by mouth daily. 90 tablet 3  . BIOTIN 5000 PO Take 2 tablets by mouth daily.     Marland Kitchen etonogestrel-ethinyl estradiol (NUVARING) 0.12-0.015 MG/24HR vaginal ring Place 1 each vaginally  every 28 (twenty-eight) days. Insert vaginally and leave in place for 3 consecutive weeks, then remove for 1 week.    . levothyroxine (SYNTHROID, LEVOTHROID) 25 MCG tablet Take 25 mcg by mouth daily.  0  . linaclotide (LINZESS) 145 MCG CAPS capsule Take 145 mcg by mouth daily.    . Multiple Vitamin (MULTIVITAMIN) capsule Take 4 capsules by mouth daily.     . naltrexone (DEPADE) 50 MG tablet Take 50 mg by mouth daily.    Marland Kitchen omeprazole (PRILOSEC) 20 MG capsule Take 20 mg by mouth daily.    . Vilazodone HCl (VIIBRYD) 40 MG TABS Take 40 mg by mouth daily.     No current facility-administered medications for this visit.     No Known Allergies  Social History   Social History  . Marital status: Divorced    Spouse name: N/A  . Number of children: N/A  . Years of education: N/A   Occupational History  . Not on file.   Social History Main Topics  . Smoking status: Never Smoker  . Smokeless tobacco: Never Used  . Alcohol use No  . Drug use: No  . Sexual activity: Yes    Birth control/ protection: Pill, Inserts   Other Topics Concern  . Not on file   Social History Narrative  . No narrative on file     Review of Systems: General: negative for chills, fever, night sweats or weight changes.  Cardiovascular: negative for chest pain, dyspnea on exertion, edema, orthopnea, palpitations, paroxysmal nocturnal dyspnea or shortness of  breath Dermatological: negative for rash Respiratory: negative for cough or wheezing Urologic: negative for hematuria Abdominal: negative for nausea, vomiting, diarrhea, bright red blood per rectum, melena, or hematemesis Neurologic: negative for visual changes, syncope, or dizziness All other systems reviewed and are otherwise negative except as noted above.    Blood pressure 126/72, pulse 78, height 5\' 3"  (1.6 m), weight 190 lb (86.2 kg).  General appearance: alert and no distress Neck: no adenopathy, no carotid bruit, no JVD, supple, symmetrical,  trachea midline and thyroid not enlarged, symmetric, no tenderness/mass/nodules Lungs: clear to auscultation bilaterally Heart: regular rate and rhythm, S1, S2 normal, no murmur, click, rub or gallop Extremities: extremities normal, atraumatic, no cyanosis or edema  EKG not performed today  ASSESSMENT AND PLAN:   Hyperlipidemia History of hyperlipidemia on statin therapy. We will recheck a lipid and liver profile  Essential hypertension History of hypertension blood pressure measures 126/72. She is on amlodipine and atenolol. I told her that she can stop her amlodipine, keep a daily blood pressure log and she will see Erasmo Downer back in one month for further evaluation. Echo would be to get her off atenolol if her blood pressure readings allowed.      Lorretta Harp MD FACP,FACC,FAHA, [facility] 10/04/2016 11:24 AM

## 2016-10-04 NOTE — Telephone Encounter (Signed)
Called patient and gave her this information.

## 2016-10-04 NOTE — Telephone Encounter (Signed)
Sent in baclofen to pharm on record, but let her know that only two days after the injection is not enough time to tell if she has had full effect of injection

## 2016-10-04 NOTE — Assessment & Plan Note (Signed)
History of hypertension blood pressure measures 126/72. She is on amlodipine and atenolol. I told her that she can stop her amlodipine, keep a daily blood pressure log and she will see [name] back in one month for further evaluation. Echo would be to get her off atenolol if her blood pressure readings allowed.

## 2016-10-05 ENCOUNTER — Encounter: Payer: Self-pay | Admitting: Cardiovascular Disease

## 2016-10-09 ENCOUNTER — Emergency Department (HOSPITAL_COMMUNITY)
Admission: EM | Admit: 2016-10-09 | Discharge: 2016-10-09 | Disposition: A | Discharge status: Home / Self Care | Payer: BLUE CROSS/BLUE SHIELD | Attending: Emergency Medicine | Admitting: Emergency Medicine

## 2016-10-09 ENCOUNTER — Encounter (HOSPITAL_COMMUNITY): Payer: Self-pay | Admitting: *Deleted

## 2016-10-09 ENCOUNTER — Emergency Department (HOSPITAL_COMMUNITY): Payer: BLUE CROSS/BLUE SHIELD

## 2016-10-09 DIAGNOSIS — I2510 Atherosclerotic heart disease of native coronary artery without angina pectoris: Secondary | ICD-10-CM | POA: Insufficient documentation

## 2016-10-09 DIAGNOSIS — K529 Noninfective gastroenteritis and colitis, unspecified: Secondary | ICD-10-CM | POA: Insufficient documentation

## 2016-10-09 DIAGNOSIS — I10 Essential (primary) hypertension: Secondary | ICD-10-CM | POA: Insufficient documentation

## 2016-10-09 DIAGNOSIS — N390 Urinary tract infection, site not specified: Secondary | ICD-10-CM | POA: Insufficient documentation

## 2016-10-09 DIAGNOSIS — Z79899 Other long term (current) drug therapy: Secondary | ICD-10-CM | POA: Insufficient documentation

## 2016-10-09 DIAGNOSIS — R1033 Periumbilical pain: Secondary | ICD-10-CM | POA: Diagnosis present

## 2016-10-09 LAB — CBC
HEMATOCRIT: 42.5 % (ref 36.0–46.0)
Hemoglobin: 14.6 g/dL (ref 12.0–15.0)
MCH: 30.7 pg (ref 26.0–34.0)
MCHC: 34.4 g/dL (ref 30.0–36.0)
MCV: 89.5 fL (ref 78.0–100.0)
PLATELETS: 342 10*3/uL (ref 150–400)
RBC: 4.75 MIL/uL (ref 3.87–5.11)
RDW: 12.6 % (ref 11.5–15.5)
WBC: 12.9 10*3/uL — AB (ref 4.0–10.5)

## 2016-10-09 LAB — URINALYSIS, ROUTINE W REFLEX MICROSCOPIC
BILIRUBIN URINE: NEGATIVE
Glucose, UA: NEGATIVE mg/dL
HGB URINE DIPSTICK: NEGATIVE
KETONES UR: NEGATIVE mg/dL
Nitrite: NEGATIVE
PROTEIN: NEGATIVE mg/dL
SPECIFIC GRAVITY, URINE: 1.02 (ref 1.005–1.030)
pH: 7 (ref 5.0–8.0)

## 2016-10-09 LAB — COMPREHENSIVE METABOLIC PANEL
ALT: 45 U/L (ref 14–54)
AST: 32 U/L (ref 15–41)
Albumin: 4.4 g/dL (ref 3.5–5.0)
Alkaline Phosphatase: 95 U/L (ref 38–126)
Anion gap: 9 (ref 5–15)
BUN: 12 mg/dL (ref 6–20)
CHLORIDE: 105 mmol/L (ref 101–111)
CO2: 25 mmol/L (ref 22–32)
CREATININE: 0.98 mg/dL (ref 0.44–1.00)
Calcium: 9 mg/dL (ref 8.9–10.3)
GFR calc non Af Amer: >60 mL/min (ref >60)
Glucose, Bld: 109 mg/dL — ABNORMAL HIGH (ref 65–99)
POTASSIUM: 3.9 mmol/L (ref 3.5–5.1)
SODIUM: 139 mmol/L (ref 135–145)
Total Bilirubin: 1.2 mg/dL (ref 0.3–1.2)
Total Protein: 7.2 g/dL (ref 6.5–8.1)

## 2016-10-09 LAB — POC URINE PREG, ED: PREG TEST UR: NEGATIVE

## 2016-10-09 LAB — LIPASE, BLOOD: LIPASE: 33 U/L (ref 11–51)

## 2016-10-09 MED ORDER — CEPHALEXIN 250 MG PO CAPS: 250.0000 mg | ORAL_CAPSULE | Freq: Four times a day (QID) | ORAL | 0 refills | Status: DC

## 2016-10-09 MED ORDER — HYDROCODONE-ACETAMINOPHEN 5-325 MG PO TABS: 1.0000 | ORAL_TABLET | Freq: Four times a day (QID) | ORAL | 0 refills | Status: DC | PRN

## 2016-10-09 MED ORDER — MORPHINE SULFATE (PF) 4 MG/ML IV SOLN
4.0000 mg | Freq: Once | INTRAVENOUS | Status: AC
  Administered 2016-10-09: 4 mg via INTRAVENOUS
  Filled 2016-10-09: qty 1

## 2016-10-09 MED ORDER — IOPAMIDOL (ISOVUE-300) INJECTION 61%
100.0000 mL | Freq: Once | INTRAVENOUS | Status: AC | PRN
  Administered 2016-10-09: 100 mL via INTRAVENOUS

## 2016-10-09 MED ORDER — DICYCLOMINE HCL 20 MG PO TABS: 20.0000 mg | ORAL_TABLET | Freq: Two times a day (BID) | ORAL | 0 refills | Status: DC

## 2016-10-09 MED ORDER — ONDANSETRON HCL 4 MG/2ML IJ SOLN
4.0000 mg | Freq: Once | INTRAMUSCULAR | Status: AC
  Administered 2016-10-09: 4 mg via INTRAVENOUS
  Filled 2016-10-09: qty 2

## 2016-10-09 MED ORDER — SODIUM CHLORIDE 0.9 % IV BOLUS (SEPSIS)
1000.0000 mL | Freq: Once | INTRAVENOUS | Status: AC
  Administered 2016-10-09: 1000 mL via INTRAVENOUS

## 2016-10-09 MED ORDER — IOPAMIDOL (ISOVUE-300) INJECTION 61%
INTRAVENOUS | Status: AC
  Filled 2016-10-09: qty 100

## 2016-10-09 NOTE — Discharge Instructions (Signed)
Your blood work and CT scan are remarkable for Enteritis, which is generally managed with supportive care.  Please follow-up with your doctor in 3 days.  Please return to the ER if you have fever, increased pain, bloody vomit, or bloody diarrhea.

## 2016-10-09 NOTE — ED Provider Notes (Signed)
8:39 AM Received phone call from Dr. Jeannine Boga regarding patient's CT.  See addendum to CT read.  I discussed the results with the patient in detail, and recommended that the patient stay in the hospital to ensure that the symptoms resolve.  The patient requests to try treatment with clear fluids at home and to observe her symptoms in the comfort of her own home.  She is not vomiting.  Her pain is relatively well controlled.  Patient is of a sound mind and has capacity to make her own medical decisions.  I have discussed clear and specific return precautions and have advised that the patient have a very low threshold for returning to the hospital. Patient understands and agrees with this plan.   Montine Circle, PA-C 10/09/16 [id number]    Ripley Fraise, MD 10/10/16 [phone]

## 2016-10-09 NOTE — ED Notes (Signed)
Bed: [id number] Expected date:  Expected time:  Means of arrival:  Comments: EMS 45 yo female abdominal pain-hx gastric bypass and hernia

## 2016-10-09 NOTE — ED Provider Notes (Signed)
[place] [facility] DEPT Provider Note   CSN: [id number] Arrival date & time: 10/09/16  0456   History   Chief Complaint Chief Complaint  Patient presents with  . Abdominal Pain    HPI [name] is a 45 y.o. female.  HPI  Patient has a PMH of gastric bypass surgery and CAD, CP, hypertension, hernia repair 4 years ago, cholecystectomy, morbid obesity, hyperlipidemia, and hypercholesterolemia comes to the ER with complaints of intermittent pain that started two days ago to her periumbilical region. Her bypass was approx 1 year ago and done at [facility]. She has not had any N/V/D.   The patient has been worse with laying and eating. She describes it as contractions the way that it becomes very intense and strong, "ball up or tighten sensation" and then it eases off. She was able to have a bowel movement thinking that perhaps she was constipated but it did not relieve her symptoms. She was seen for the same 1-2 months ago and [facility] ER but after a full evaluation they were unable to find the cause of her pain.   Past Medical History:  Diagnosis Date  . Abnormal Pap smear   . 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 Chest pain at rest, with strong family history of CAD. 07/27/2012  . High cholesterol   . Hyperlipidemia 07/27/2012  . Hypertension   . Morbid obesity (HCC)    BMI 44  . Stomach ulcer     Patient Active Problem List   Diagnosis Date Noted  . Radiculopathy due to lumbar intervertebral disc disorder 10/02/2016  . Family history of coronary artery disease 09/07/2014  . DOE (dyspnea on exertion) 09/07/2014  . Hypothyroid 09/07/2014  . Back pain 09/07/2014  . Excessive sweating 08/04/2014  . Essential hypertension 10/18/2013  . 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 Chest pain with moderate risk of acute coronary syndrome 07/27/2012  . Hyperlipidemia 07/27/2012  .  Menopausal symptom 02/06/2012  . Fatigue 02/06/2012  . Morbid obesity ([place]) 02/06/2012  . Bariatric surgery status 02/06/2012    Past Surgical History:  Procedure Laterality Date  . CARDIOVASCULAR STRESS TEST  10/01/2012   No significant ST segment change suggestive of ischemia.  Marland Kitchen CARPAL TUNNEL RELEASE     2001  . CHOLECYSTECTOMY     2003  . COSMETIC SURGERY     2009  . FOOT SURGERY     2004  . gastic bypass    . HERNIA REPAIR     2008  . LEFT HEART CATHETERIZATION WITH CORONARY ANGIOGRAM N/A 10/03/2014   Procedure: LEFT HEART CATHETERIZATION WITH CORONARY ANGIOGRAM;  Surgeon: Lorretta Harp, MD;  Location: [facility] CATH LAB;  Service: Cardiovascular;  Laterality: N/A;  . TRANSTHORACIC ECHOCARDIOGRAM  04/24/2010   EF >55%, normal LV systolic function    OB History    Gravida Para Term Preterm AB Living   1 1 1  0 0 1   SAB TAB Ectopic Multiple Live Births   0 0 0 0 1       Home Medications    Prior to Admission medications   Medication Sig Start Date End Date Taking? Authorizing Provider  atenolol (TENORMIN) 25 MG tablet Take 1 tablet (25 mg total) by mouth at bedtime. 09/29/15   Lorretta Harp, MD  atorvastatin (LIPITOR) 40 MG tablet Take 1 tablet (40 mg total) by mouth daily. 09/29/15  Lorretta Harp, MD  BIOTIN 5000 PO Take 2 tablets by mouth daily.     Historical Provider, MD  etonogestrel-ethinyl estradiol (NUVARING) 0.12-0.015 MG/24HR vaginal ring Place 1 each vaginally every 28 (twenty-eight) days. Insert vaginally and leave in place for 3 consecutive weeks, then remove for 1 week.    Historical Provider, MD  levothyroxine (SYNTHROID, LEVOTHROID) 25 MCG tablet Take 25 mcg by mouth daily. 09/01/14   Historical Provider, MD  linaclotide Rolan Lipa) 145 MCG CAPS capsule Take 145 mcg by mouth daily. 10/11/15   Historical Provider, MD  Multiple Vitamin (MULTIVITAMIN) capsule Take 4 capsules by mouth daily.     Historical Provider, MD  naltrexone (DEPADE) 50 MG tablet Take  50 mg by mouth daily. 08/13/16   Historical Provider, MD  omeprazole (PRILOSEC) 20 MG capsule Take 20 mg by mouth daily.    Historical Provider, MD  Vilazodone HCl (VIIBRYD) 40 MG TABS Take 40 mg by mouth daily. 07/05/16   Historical Provider, MD    Family History Family History  Problem Relation Age of Onset  . Diabetes Paternal Grandfather   . Breast cancer Paternal Grandmother   . Breast cancer Maternal Grandmother   . Diabetes Father   . Coronary artery disease Father   . Coronary artery disease Mother     Social History Social History  Substance Use Topics  . Smoking status: Never Smoker  . Smokeless tobacco: Never Used  . Alcohol use No     Allergies   Patient has no known allergies.   Review of Systems Review of Systems Review of Systems All other systems negative except as documented in the HPI. All pertinent positives and negatives as reviewed in the HPI.   Physical Exam Updated Vital Signs BP 150/94 (BP Location: Right Arm)   Pulse 66   Temp 99 F (37.2 C) (Oral)   Resp 17   Ht 5\' 3"  (1.6 m)   Wt 83.9 kg   SpO2 97%   BMI 32.77 kg/m   Physical Exam  Constitutional: She appears well-developed and well-nourished.  HENT:  Head: Normocephalic and atraumatic.  Eyes: Conjunctivae are normal. Pupils are equal, round, and reactive to light.  Neck: Trachea normal, normal range of motion and full passive range of motion without pain. Neck supple.  Cardiovascular: Normal rate, regular rhythm and normal pulses.   Pulmonary/Chest: Effort normal and breath sounds normal. Chest wall is not dull to percussion. She exhibits no tenderness, no crepitus, no edema, no deformity and no retraction.  Abdominal: Soft. Normal appearance and bowel sounds are normal. There is tenderness in the periumbilical area. There is guarding (voluntary with deep palpation). There is no rigidity, no rebound, no CVA tenderness and negative Murphy's sign.  No hernia appreciated but exam mildly  limited by body habitus  Musculoskeletal: Normal range of motion.  Neurological: She is alert. She has normal strength.  Skin: Skin is warm, dry and intact.  Psychiatric: She has a normal mood and affect. Her speech is normal and behavior is normal. Judgment and thought content normal. Cognition and memory are normal.     ED Treatments / Results  Labs (all labs ordered are listed, but only abnormal results are displayed) Labs Reviewed  LIPASE, BLOOD  COMPREHENSIVE METABOLIC PANEL  CBC  URINALYSIS, ROUTINE W REFLEX MICROSCOPIC  POC URINE PREG, ED    EKG  EKG Interpretation None       Radiology No results found.  Procedures Procedures (including critical care time)  Medications Ordered  in ED Medications - No data to display   Initial Impression / Assessment and Plan / ED Course  I have reviewed the triage vital signs and the nursing notes.  Pertinent labs & imaging results that were available during my care of the patient were reviewed by me and considered in my medical decision making (see chart for details).  Clinical Course     At end of shift patient care handed off to [employer], PA-C. Labs, CT abd/pelv, pain/nausea and fluids. Ordered/pending. Patient is aware of new provider to be following up on her care at 6:00am  Final Clinical Impressions(s) / ED Diagnoses   Final diagnoses:  None    New Prescriptions New Prescriptions   No medications on file     Delos Haring, Hershal Coria 10/09/16 [place], MD 10/09/16 [phone]

## 2016-10-09 NOTE — ED Triage Notes (Signed)
Periumbilical pain started about 2 days ago.  Intermittent pain.  Pt had gastric bypass surgery about a year.  Denies N/V/D. But did have a hernia repair about 4 years ago.

## 2016-10-09 NOTE — ED Provider Notes (Signed)
VSS.  CT shows enteritis.  No acute surgical process.  Pain has improved.  Possible UTI/cystitis.    Recommend outpatient treatment with supportive care.    Keflex for UTI.    PCP follow-up.  Patient instructed to return for:  New or worsening symptoms, including, increased abdominal pain, especially pain that localizes to one side, bloody vomit, bloody diarrhea, fever >101, and intractable vomiting.    Montine Circle, PA-C 10/09/16 [id number]    Ripley Fraise, MD 10/10/16 [phone]

## 2016-10-21 ENCOUNTER — Other Ambulatory Visit: Payer: Self-pay | Admitting: Cardiovascular Disease

## 2016-10-22 NOTE — Telephone Encounter (Signed)
Rx has been sent to the pharmacy electronically. ° °

## 2016-10-24 ENCOUNTER — Telehealth: Payer: Self-pay | Admitting: Cardiovascular Disease

## 2016-10-24 NOTE — Telephone Encounter (Signed)
Returned call to patient left message on personal voice mail I will send message to [facility] Dr.Berry[facility] [place].

## 2016-10-24 NOTE — Telephone Encounter (Signed)
Pt faxed over her lab results on 10-16-16.She wants to know if Dr Gwenlyn Found received them and if she still need to get more lab work?

## 2016-11-07 ENCOUNTER — Ambulatory Visit: Payer: BLUE CROSS/BLUE SHIELD

## 2016-11-11 NOTE — Telephone Encounter (Signed)
Left message on mobile number--ok per DPR--stating we did receive labs and they have been scanned into chart.

## 2016-11-29 ENCOUNTER — Ambulatory Visit (INDEPENDENT_AMBULATORY_CARE_PROVIDER_SITE_OTHER): Payer: BLUE CROSS/BLUE SHIELD | Admitting: Surgery

## 2016-11-29 ENCOUNTER — Encounter (INDEPENDENT_AMBULATORY_CARE_PROVIDER_SITE_OTHER): Payer: Self-pay | Admitting: Surgery

## 2016-11-29 ENCOUNTER — Ambulatory Visit (INDEPENDENT_AMBULATORY_CARE_PROVIDER_SITE_OTHER): Payer: Self-pay

## 2016-11-29 DIAGNOSIS — M25572 Pain in left ankle and joints of left foot: Secondary | ICD-10-CM | POA: Insufficient documentation

## 2016-11-29 NOTE — Progress Notes (Signed)
Office Visit Note   Patient: [name]           Date of Birth: 1971/01/12           MRN: [id number] Visit Date: 11/29/2016              Requested by: Leighton Ruff, MD [place], [place] [address] PCP: Gerrit Heck, MD   Assessment & Plan: Visit Diagnoses:  1. Pain in left ankle and joints of left foot     Plan: Advised patient that I am not seeing anything on her exam today. X-rays were also unremarkable. When she does get pain she points to the area around the peroneal tendon. She could have had acute tendinitis. Recommend that she use ice as needed and she can also get an over-the-counter ankle compression brace for comfort if needed. States that she does have a history of gastric bypass and gastric ulcers so I am not recommending that she use oral NSAIDs. States that she was told by her physician that she could use small amounts of Advil which she has been taking but again I am not recommending this. She will follow up in the office in a few weeks for recheck but can cancel if not having any issues.   Can cancelkFollow-Up Instructions: Return in about 1 month (around 12/27/2016).   Orders:  Orders Placed This Encounter  Procedures  . XR Ankle Complete Left   No orders of the defined types were placed in this encounter.     Procedures: No procedures performed   Clinical Data: No additional findings.   Subjective: Chief Complaint  Patient presents with  . Left Ankle - Pain    HPI  Review of Systems  Constitutional: Negative.   HENT: Negative.   Respiratory: Negative.   Cardiovascular: Negative.   Gastrointestinal: Negative.   Genitourinary: Negative.   Neurological: Negative.      Objective: Vital Signs: There were no vitals taken for this visit.  Physical Exam  Constitutional: She is oriented to person, place, and time. No distress.  HENT:  Head: Normocephalic and atraumatic.  Eyes: EOM are normal. Pupils are  equal, round, and reactive to light.  Neck: Normal range of motion.  Pulmonary/Chest: No respiratory distress.  Abdominal: She exhibits no distension.  Musculoskeletal: Normal range of motion. She exhibits no edema, tenderness or deformity.  Exam left ankle good range of motion. Ankle ligaments are stable. I cannot find any areas of tenderness. She can toe walk without difficulty. No swelling or bruising.  Neurological: She is alert and oriented to person, place, and time.  Skin: Skin is warm and dry.    Ortho Exam  Specialty Comments:  No specialty comments available.  Imaging: No results found.   PMFS History: Patient Active Problem List   Diagnosis Date Noted  . Pain in left ankle and joints of left foot 11/29/2016  . Radiculopathy due to lumbar intervertebral disc disorder 10/02/2016  . Family history of coronary artery disease 09/07/2014  . DOE (dyspnea on exertion) 09/07/2014  . Hypothyroid 09/07/2014  . Back pain 09/07/2014  . Excessive sweating 08/04/2014  . Essential hypertension 10/18/2013  . 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 Chest pain with moderate risk of acute coronary syndrome 07/27/2012  . Hyperlipidemia 07/27/2012  . Menopausal symptom 02/06/2012  . Fatigue 02/06/2012  . Morbid obesity ([place]) 02/06/2012  . Bariatric surgery status 02/06/2012   Past Medical History:  Diagnosis Date  .  Abnormal Pap smear   . 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 Chest pain at rest, with strong family history of CAD. 07/27/2012  . High cholesterol   . Hyperlipidemia 07/27/2012  . Hypertension   . Morbid obesity (HCC)    BMI 44  . Stomach ulcer     Family History  Problem Relation Age of Onset  . Diabetes Paternal Grandfather   . Breast cancer Paternal Grandmother   . Breast cancer Maternal Grandmother   . Diabetes Father   . Coronary artery disease Father   .  Coronary artery disease Mother     Past Surgical History:  Procedure Laterality Date  . CARDIOVASCULAR STRESS TEST  10/01/2012   No significant ST segment change suggestive of ischemia.  Marland Kitchen CARPAL TUNNEL RELEASE     2001  . CHOLECYSTECTOMY     2003  . COSMETIC SURGERY     2009  . FOOT SURGERY     2004  . gastic bypass    . HERNIA REPAIR     2008  . LEFT HEART CATHETERIZATION WITH CORONARY ANGIOGRAM N/A 10/03/2014   Procedure: LEFT HEART CATHETERIZATION WITH CORONARY ANGIOGRAM;  Surgeon: Lorretta Harp, MD;  Location: [facility] CATH LAB;  Service: Cardiovascular;  Laterality: N/A;  . TRANSTHORACIC ECHOCARDIOGRAM  04/24/2010   EF >55%, normal LV systolic function   Social History   Occupational History  . Not on file.   Social History Main Topics  . Smoking status: Never Smoker  . Smokeless tobacco: Never Used  . Alcohol use No  . Drug use: No  . Sexual activity: Yes    Birth control/ protection: Pill, Inserts

## 2016-12-05 ENCOUNTER — Other Ambulatory Visit: Payer: Self-pay | Admitting: Cardiovascular Disease

## 2016-12-05 NOTE — Telephone Encounter (Signed)
Rx(s) sent to pharmacy electronically.  

## 2017-03-14 ENCOUNTER — Other Ambulatory Visit: Payer: Self-pay | Admitting: Obstetrics and Gynecology

## 2017-04-15 ENCOUNTER — Encounter (HOSPITAL_BASED_OUTPATIENT_CLINIC_OR_DEPARTMENT_OTHER): Payer: Self-pay | Admitting: *Deleted

## 2017-04-15 NOTE — Progress Notes (Signed)
NPO AFTER MN.  ARRIVE AT 0830.  NEEDS URINE PREG.  GETTING CBC AND BMET DONE PRIOR TO DOS.  WILL TAKE PRILOSEC AND SYNTHROID AM DOS W/ SIPS OF WATER.

## 2017-04-22 ENCOUNTER — Encounter (HOSPITAL_BASED_OUTPATIENT_CLINIC_OR_DEPARTMENT_OTHER)
Admission: RE | Disposition: A | Discharge status: Home / Self Care | Payer: Self-pay | Source: Ambulatory Visit | Attending: Obstetrics and Gynecology

## 2017-04-22 ENCOUNTER — Ambulatory Visit (HOSPITAL_BASED_OUTPATIENT_CLINIC_OR_DEPARTMENT_OTHER): Payer: BLUE CROSS/BLUE SHIELD | Admitting: Anesthesiology

## 2017-04-22 ENCOUNTER — Ambulatory Visit (HOSPITAL_BASED_OUTPATIENT_CLINIC_OR_DEPARTMENT_OTHER)
Admission: RE | Admit: 2017-04-22 | Discharge: 2017-04-22 | Disposition: A | Discharge status: Home / Self Care | Payer: BLUE CROSS/BLUE SHIELD | Source: Ambulatory Visit | Attending: Obstetrics and Gynecology | Admitting: Obstetrics and Gynecology

## 2017-04-22 ENCOUNTER — Encounter (HOSPITAL_BASED_OUTPATIENT_CLINIC_OR_DEPARTMENT_OTHER): Payer: Self-pay | Admitting: Anesthesiology

## 2017-04-22 DIAGNOSIS — F3281 Premenstrual dysphoric disorder: Secondary | ICD-10-CM | POA: Insufficient documentation

## 2017-04-22 DIAGNOSIS — N870 Mild cervical dysplasia: Secondary | ICD-10-CM | POA: Insufficient documentation

## 2017-04-22 DIAGNOSIS — E785 Hyperlipidemia, unspecified: Secondary | ICD-10-CM | POA: Diagnosis not present

## 2017-04-22 DIAGNOSIS — E669 Obesity, unspecified: Secondary | ICD-10-CM | POA: Diagnosis not present

## 2017-04-22 DIAGNOSIS — Z8719 Personal history of other diseases of the digestive system: Secondary | ICD-10-CM | POA: Diagnosis not present

## 2017-04-22 DIAGNOSIS — Z8249 Family history of ischemic heart disease and other diseases of the circulatory system: Secondary | ICD-10-CM | POA: Insufficient documentation

## 2017-04-22 DIAGNOSIS — Z9884 Bariatric surgery status: Secondary | ICD-10-CM | POA: Insufficient documentation

## 2017-04-22 DIAGNOSIS — F329 Major depressive disorder, single episode, unspecified: Secondary | ICD-10-CM | POA: Diagnosis not present

## 2017-04-22 DIAGNOSIS — I10 Essential (primary) hypertension: Secondary | ICD-10-CM | POA: Diagnosis not present

## 2017-04-22 DIAGNOSIS — R87619 Unspecified abnormal cytological findings in specimens from cervix uteri: Secondary | ICD-10-CM

## 2017-04-22 DIAGNOSIS — R87610 Atypical squamous cells of undetermined significance on cytologic smear of cervix (ASC-US): Secondary | ICD-10-CM

## 2017-04-22 DIAGNOSIS — E039 Hypothyroidism, unspecified: Secondary | ICD-10-CM | POA: Insufficient documentation

## 2017-04-22 DIAGNOSIS — Z6833 Body mass index (BMI) 33.0-33.9, adult: Secondary | ICD-10-CM | POA: Insufficient documentation

## 2017-04-22 DIAGNOSIS — Z79899 Other long term (current) drug therapy: Secondary | ICD-10-CM | POA: Insufficient documentation

## 2017-04-22 DIAGNOSIS — N871 Moderate cervical dysplasia: Secondary | ICD-10-CM | POA: Diagnosis present

## 2017-04-22 HISTORY — DX: Unspecified abnormal cytological findings in specimens from cervix uteri: R87.619

## 2017-04-22 HISTORY — DX: Personal history of peptic ulcer disease: Z87.11

## 2017-04-22 HISTORY — DX: Bariatric surgery status: Z98.84

## 2017-04-22 HISTORY — DX: Personal history of other diseases of the digestive system: Z87.19

## 2017-04-22 HISTORY — DX: Other constipation: K59.09

## 2017-04-22 HISTORY — DX: Presence of spectacles and contact lenses: Z97.3

## 2017-04-22 HISTORY — DX: Hypothyroidism, unspecified: E03.9

## 2017-04-22 HISTORY — DX: Family history of ischemic heart disease and other diseases of the circulatory system: Z82.49

## 2017-04-22 HISTORY — DX: Adverse effect of unspecified anesthetic, initial encounter: T41.45XA

## 2017-04-22 HISTORY — DX: Other complications of anesthesia, initial encounter: T88.59XA

## 2017-04-22 HISTORY — PX: CERVICAL CONIZATION W/BX: SHX1330

## 2017-04-22 LAB — CBC
HCT: 37.8 % (ref 36.0–46.0)
Hemoglobin: 12.7 g/dL (ref 12.0–15.0)
MCH: 30.1 pg (ref 26.0–34.0)
MCHC: 33.6 g/dL (ref 30.0–36.0)
MCV: 89.6 fL (ref 78.0–100.0)
PLATELETS: 321 10*3/uL (ref 150–400)
RBC: 4.22 MIL/uL (ref 3.87–5.11)
RDW: 13.3 % (ref 11.5–15.5)
WBC: 11.1 10*3/uL — ABNORMAL HIGH (ref 4.0–10.5)

## 2017-04-22 LAB — BASIC METABOLIC PANEL
Anion gap: 8 (ref 5–15)
BUN: 12 mg/dL (ref 6–20)
CALCIUM: 9 mg/dL (ref 8.9–10.3)
CO2: 22 mmol/L (ref 22–32)
Chloride: 108 mmol/L (ref 101–111)
Creatinine, Ser: 1.05 mg/dL — ABNORMAL HIGH (ref 0.44–1.00)
GFR calc Af Amer: >60 mL/min (ref >60)
GLUCOSE: 83 mg/dL (ref 65–99)
Potassium: 3.6 mmol/L (ref 3.5–5.1)
SODIUM: 138 mmol/L (ref 135–145)

## 2017-04-22 SURGERY — CONE BIOPSY, CERVIX
Anesthesia: Monitor Anesthesia Care | Site: Vagina

## 2017-04-22 MED ORDER — LACTATED RINGERS IV SOLN
INTRAVENOUS | Status: DC
  Administered 2017-04-22 (×2): via INTRAVENOUS
  Filled 2017-04-22: qty 1000

## 2017-04-22 MED ORDER — MEPERIDINE HCL 25 MG/ML IJ SOLN
6.2500 mg | INTRAMUSCULAR | Status: DC | PRN
  Filled 2017-04-22: qty 1

## 2017-04-22 MED ORDER — LIDOCAINE 2% (20 MG/ML) 5 ML SYRINGE
INTRAMUSCULAR | Status: DC | PRN
  Administered 2017-04-22: 50 mg via INTRAVENOUS

## 2017-04-22 MED ORDER — GELATIN ABSORBABLE 12-7 MM EX MISC
CUTANEOUS | Status: DC | PRN
  Administered 2017-04-22: 1

## 2017-04-22 MED ORDER — HYDROMORPHONE HCL 1 MG/ML IJ SOLN
0.2500 mg | INTRAMUSCULAR | Status: DC | PRN
  Administered 2017-04-22 (×2): 0.5 mg via INTRAVENOUS
  Filled 2017-04-22: qty 0.5

## 2017-04-22 MED ORDER — IODINE STRONG (LUGOLS) 5 % PO SOLN
ORAL | Status: DC | PRN
  Administered 2017-04-22: 0.1 mL via ORAL

## 2017-04-22 MED ORDER — OXYCODONE HCL 5 MG PO TABS
ORAL_TABLET | ORAL | Status: AC
  Filled 2017-04-22: qty 1

## 2017-04-22 MED ORDER — HYDROCODONE-ACETAMINOPHEN 5-300 MG PO TABS: 1.0000 | ORAL_TABLET | Freq: Four times a day (QID) | ORAL | 0 refills | Status: DC | PRN

## 2017-04-22 MED ORDER — MIDAZOLAM HCL 5 MG/5ML IJ SOLN
INTRAMUSCULAR | Status: DC | PRN
  Administered 2017-04-22: 2 mg via INTRAVENOUS

## 2017-04-22 MED ORDER — KETOROLAC TROMETHAMINE 30 MG/ML IJ SOLN
INTRAMUSCULAR | Status: DC | PRN
  Administered 2017-04-22: 30 mg via INTRAVENOUS

## 2017-04-22 MED ORDER — DEXAMETHASONE SODIUM PHOSPHATE 4 MG/ML IJ SOLN
INTRAMUSCULAR | Status: DC | PRN
  Administered 2017-04-22: 10 mg via INTRAVENOUS

## 2017-04-22 MED ORDER — HYDROMORPHONE HCL 1 MG/ML IJ SOLN
INTRAMUSCULAR | Status: AC
  Filled 2017-04-22: qty 0.5

## 2017-04-22 MED ORDER — OXYCODONE HCL 5 MG/5ML PO SOLN
5.0000 mg | Freq: Once | ORAL | Status: AC | PRN
  Filled 2017-04-22: qty 5

## 2017-04-22 MED ORDER — FENTANYL CITRATE (PF) 100 MCG/2ML IJ SOLN
INTRAMUSCULAR | Status: DC | PRN
Start: 2017-04-22 — End: 2017-04-22
  Administered 2017-04-22: 50 ug via INTRAVENOUS
  Administered 2017-04-22 (×2): 25 ug via INTRAVENOUS

## 2017-04-22 MED ORDER — FENTANYL CITRATE (PF) 100 MCG/2ML IJ SOLN
INTRAMUSCULAR | Status: AC
  Filled 2017-04-22: qty 2

## 2017-04-22 MED ORDER — MIDAZOLAM HCL 2 MG/2ML IJ SOLN
INTRAMUSCULAR | Status: AC
Start: 2017-04-22 — End: 2017-04-22
  Filled 2017-04-22: qty 2

## 2017-04-22 MED ORDER — KETAMINE HCL 10 MG/ML IJ SOLN
INTRAMUSCULAR | Status: AC
  Filled 2017-04-22: qty 1

## 2017-04-22 MED ORDER — LIDOCAINE HCL 2 % IJ SOLN
INTRAMUSCULAR | Status: DC | PRN
  Administered 2017-04-22: 20 mL

## 2017-04-22 MED ORDER — SODIUM CHLORIDE 0.9 % IV SOLN
INTRAVENOUS | Status: DC | PRN
  Administered 2017-04-22: 20 ug/kg/min via INTRAVENOUS

## 2017-04-22 MED ORDER — OXYCODONE HCL 5 MG PO TABS
5.0000 mg | ORAL_TABLET | Freq: Once | ORAL | Status: AC | PRN
  Administered 2017-04-22: 5 mg via ORAL
  Filled 2017-04-22: qty 1

## 2017-04-22 MED ORDER — VASOPRESSIN 20 UNIT/ML IV SOLN
INTRAVENOUS | Status: DC | PRN
  Administered 2017-04-22: 51 mL via INTRAMUSCULAR

## 2017-04-22 MED ORDER — PROPOFOL 500 MG/50ML IV EMUL
INTRAVENOUS | Status: DC | PRN
  Administered 2017-04-22: 200 ug/kg/min via INTRAVENOUS

## 2017-04-22 MED ORDER — IBUPROFEN 600 MG PO TABS: 600.0000 mg | ORAL_TABLET | Freq: Four times a day (QID) | ORAL | 0 refills | Status: DC | PRN

## 2017-04-22 MED ORDER — PROMETHAZINE HCL 25 MG/ML IJ SOLN
6.2500 mg | INTRAMUSCULAR | Status: DC | PRN
  Filled 2017-04-22: qty 1

## 2017-04-22 MED ORDER — TRAMADOL HCL 50 MG PO TABS: 50.0000 mg | ORAL_TABLET | Freq: Four times a day (QID) | ORAL | 0 refills | Status: DC | PRN

## 2017-04-22 MED ORDER — ONDANSETRON HCL 4 MG/2ML IJ SOLN
INTRAMUSCULAR | Status: DC | PRN
  Administered 2017-04-22: 4 mg via INTRAVENOUS

## 2017-04-22 SURGICAL SUPPLY — 36 items
APPLICATOR COTTON TIP 6IN STRL (MISCELLANEOUS) ×6 IMPLANT
BLADE SURG 11 STRL SS (BLADE) ×2 IMPLANT
CATH ROBINSON RED A/P 16FR (CATHETERS) ×2 IMPLANT
COUNTER NEEDLE 1200 MAGNETIC (NEEDLE) ×1 IMPLANT
DRAPE LG THREE QUARTER DISP (DRAPES) ×1 IMPLANT
ELECT BALL LEEP 3MM BLK (ELECTRODE) IMPLANT
ELECT BALL LEEP 5MM RED (ELECTRODE) IMPLANT
ELECT LOOP LEEP RND 15X12 GRN (CUTTING LOOP)
ELECT LOOP LEEP RND 20X12 WHT (CUTTING LOOP)
ELECT REM PT RETURN 9FT ADLT (ELECTROSURGICAL) ×2
ELECTRODE LOOP LP RND 15X12GRN (CUTTING LOOP) IMPLANT
ELECTRODE LOOP LP RND 20X12WHT (CUTTING LOOP) IMPLANT
ELECTRODE REM PT RTRN 9FT ADLT (ELECTROSURGICAL) IMPLANT
GLOVE SURG SS PI 6.5 STRL IVOR (GLOVE) ×4 IMPLANT
GOWN STRL REUS W/TWL LRG LVL3 (GOWN DISPOSABLE) ×4 IMPLANT
KIT RM TURNOVER CYSTO AR (KITS) ×2 IMPLANT
LOOP 2 FIBERLINK CLOSED (SUTURE) ×2 IMPLANT
NDL SPNL 22GX3.5 QUINCKE BK (NEEDLE) IMPLANT
NEEDLE SPNL 22GX3.5 QUINCKE BK (NEEDLE) ×2 IMPLANT
PACK VAGINAL MINOR WOMEN LF (CUSTOM PROCEDURE TRAY) ×2 IMPLANT
PAD OB MATERNITY 4.3X12.25 (PERSONAL CARE ITEMS) ×2 IMPLANT
PENCIL BUTTON HOLSTER BLD 10FT (ELECTRODE) ×1 IMPLANT
SCOPETTES 8  STERILE (MISCELLANEOUS) ×2
SCOPETTES 8 STERILE (MISCELLANEOUS) ×2 IMPLANT
SPONGE SURGIFOAM ABS GEL 12-7 (HEMOSTASIS) ×1 IMPLANT
SUT VIC AB 0 CT1 18XCR BRD8 (SUTURE) ×1 IMPLANT
SUT VIC AB 0 CT1 27 (SUTURE) ×2
SUT VIC AB 0 CT1 27XCR 8 STRN (SUTURE) IMPLANT
SUT VIC AB 0 CT1 8-18 (SUTURE) ×2
SUT VIC AB 0 CT2 27 (SUTURE) IMPLANT
SYR CONTROL 10ML LL (SYRINGE) ×1 IMPLANT
SYR TB 1ML 27GX1/2 SAFE (SYRINGE) IMPLANT
SYR TB 1ML 27GX1/2 SAFETY (SYRINGE)
TOWEL OR 17X24 6PK STRL BLUE (TOWEL DISPOSABLE) ×4 IMPLANT
TUBE CONNECTING 12X1/4 (SUCTIONS) ×2 IMPLANT
YANKAUER SUCT BULB TIP NO VENT (SUCTIONS) ×2 IMPLANT

## 2017-04-22 NOTE — Anesthesia Procedure Notes (Signed)
Procedure Name: MAC Date/Time: 04/22/2017 10:12 AM Performed by: Wanita Chamberlain Pre-anesthesia Checklist: Patient identified, Timeout performed, Emergency Drugs available, Suction available and Patient being monitored Patient Re-evaluated:Patient Re-evaluated prior to inductionOxygen Delivery Method: Nasal cannula Intubation Type: IV induction Placement Confirmation: positive ETCO2 and CO2 detector Dental Injury: Teeth and Oropharynx as per pre-operative assessment

## 2017-04-22 NOTE — Anesthesia Postprocedure Evaluation (Signed)
Anesthesia Post Note  Patient: Teaching laboratory technician  Procedure(s) Performed: Procedure(s) (LRB): CONIZATION CERVIX WITH BIOPSY (N/A)     Patient location during evaluation: PACU Anesthesia Type: MAC Level of consciousness: awake and alert Pain management: pain level controlled Vital Signs Assessment: post-procedure vital signs reviewed and stable Respiratory status: spontaneous breathing, nonlabored ventilation and respiratory function stable Cardiovascular status: stable and blood pressure returned to baseline Anesthetic complications: no    Last Vitals:  Vitals:   04/22/17 1200 04/22/17 1215  BP: 134/67 122/81  Pulse: (!) 54 78  Resp: 18 (!) 22  Temp:      Last Pain:  Vitals:   04/22/17 1215  TempSrc:   PainSc: [place]

## 2017-04-22 NOTE — H&P (Addendum)
[name] is an 46 y.o. female. 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 Bleeding: none Contraception: NuvaRing vaginal inserts DES exposure: unknown Blood transfusions: none Sexually transmitted diseases: no past history Previous GYN Procedures: none  Last mammogram: normal Date: 08/2016 Last pap: abnormal: ASCUS pos HR HPV Date: 2018    Menstrual History: Menarche age:24 No LMP recorded. Patient is not currently having periods (Reason: Continuous Nuvaring use)    Past Medical History:  Diagnosis Date  . Abnormal cytological finding in specimen from cervix   . Chronic constipation   . Complication of anesthesia    post-op  urinary retention (per pt happens after every surgery)  . Family history of coronary artery disease   . History of gastric ulcer   . Hyperlipidemia   . Hypertension   . Hypothyroidism   . 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  at  [facility]  . Wears contact lenses     Past Surgical History:  Procedure Laterality Date  . ANAL FISSURE REPAIR  07/10/2000  . BUNIONECTOMY Right 2004  . CARDIOVASCULAR STRESS TEST  09-21-2014  dr berry   nuclear study w/ a reversible anterior wall perfusion abnormality in the distribution of the LAD artery, consistant with ischemia (this was a 2-day study in a marked breast attenutation artifact, high likeihood of "shifting breast artifact")/  normal LV function and wall motion , ef 68%  . CARPAL TUNNEL RELEASE Bilateral early 2000s  . COMBINED REDUCTION MAMMAPLASTY W/ ABDOMINOPLASTY  04/2009  . LAPAROSCOPIC CHOLECYSTECTOMY  08/12/2003  . LEFT HEART CATHETERIZATION WITH CORONARY ANGIOGRAM N/A 10/03/2014   Procedure: LEFT HEART CATHETERIZATION WITH CORONARY ANGIOGRAM;  Surgeon: Lorretta Harp, MD;  Location: [facility] CATH LAB;  Service: Cardiovascular;  Laterality: N/A;  essentially normal coronary arteries and  normal LVF (false postitive stress test and her symptoms noncardiac)  . NASAL SEPTOPLASTY W/ TURBINOPLASTY  07/14/2006  . REPAIR PERIUMBILICAL HERNIA W/ MESH  03/21/2007  . ROUX-EN-Y GASTRIC BYPASS  07-18-2015    [facility]   via Laparoscopy  . TRANSTHORACIC ECHOCARDIOGRAM  09-21-2014   dr berry   grade 2 diastolic dysfunction, ef 26-71%/  trivial MR and TR    Family History  Problem Relation Age of Onset  . Diabetes Paternal Grandfather   . Breast cancer Paternal Grandmother   . Breast cancer Maternal Grandmother   . Diabetes Father   . Coronary artery disease Father   . Coronary artery disease Mother     Social History:  reports that she has never smoked. She has never used smokeless tobacco. She reports that she does not drink alcohol or use drugs.  Allergies: No Known Allergies  Prescriptions Prior to Admission  Medication Sig Dispense Refill Last Dose  . atenolol (TENORMIN) 25 MG tablet take 1 tablet by mouth at bedtime 90 tablet 3 Taking  . atorvastatin (LIPITOR) 40 MG tablet take 1 tablet by mouth once daily (Patient taking differently: take 1 tablet by mouth once daily--- takes in pm) 90 tablet 3   . etonogestrel-ethinyl estradiol (NUVARING) 0.12-0.015 MG/24HR vaginal ring Place 1 each vaginally every 28 (twenty-eight) days. Insert vaginally and leave in place for 3 consecutive weeks, then remove for 1 week.   Taking  . levothyroxine (SYNTHROID, LEVOTHROID) 25 MCG tablet Take 25 mcg by mouth daily before breakfast.   0 Taking  . linaclotide (LINZESS) 145 MCG CAPS capsule Take 145 mcg by mouth daily.  Taking  . Multiple Vitamin (MULTIVITAMIN) capsule Take 1 capsule by mouth 3 (three) times daily.    Taking  . naltrexone (DEPADE) 50 MG tablet Take 50 mg by mouth as needed.    Taking  . omeprazole (PRILOSEC) 40 MG capsule Take 40 mg by mouth daily.   Taking  . Vilazodone HCl (VIIBRYD) 40 MG TABS Take 40 mg by mouth daily after breakfast.    Taking    Review of Systems   Genitourinary:       Pt has hx of urinary retention after general anesthesia and has discussed this with anesthesiologist   Psychiatric/Behavioral:       PMDD well managed with continuous Nuvaring  All other systems reviewed and are negative.   Height 5' 2.5" (1.588 m), weight 180 lb (81.6 kg). Physical Exam  Constitutional: She appears well-developed and well-nourished.  HENT:  Head: Normocephalic and atraumatic.  Eyes: EOM are normal.  Neck: Normal range of motion. Neck supple.  Cardiovascular: Normal rate and regular rhythm.   Respiratory: Effort normal and breath sounds normal.  GI: Soft. Bowel sounds are normal.  Genitourinary:  Genitourinary Comments: Pelvic exam: normal external genitalia, vulva, vagina, cervix, uterus and adnexa.    Assessment/Plan: LGSIL which extends into endocervical glands in pt with at least 4 year hx of pap abnormality.  Conization of cervix recommended for  Further dx and possible treatment.  Risks of anesthesia, bleeding, infection and damage to adjacent organs reviewed and pt wishes to proceed.  Channing Yeager P 04/22/2017, 8:15 AM

## 2017-04-22 NOTE — Anesthesia Preprocedure Evaluation (Deleted)

## 2017-04-22 NOTE — Transfer of Care (Signed)
Immediate Anesthesia Transfer of Care Note  Patient: Teaching laboratory technician  Procedure(s) Performed: Procedure(s): CONIZATION CERVIX WITH BIOPSY (N/A)  Patient Location: PACU  Anesthesia Type:MAC  Level of Consciousness: awake, alert , oriented and patient cooperative  Airway & Oxygen Therapy: Patient Spontanous Breathing and Patient connected to nasal cannula oxygen  Post-op Assessment: Report given to RN and Post -op Vital signs reviewed and stable  Post vital signs: Reviewed and stable  Last Vitals:  Vitals:   04/22/17 0818 04/22/17 1121  BP: 117/72   Pulse: 63   Resp: 16   Temp: 36.8 C 36.4 C    Last Pain:  Vitals:   04/22/17 0818  TempSrc: Oral      Patients Stated Pain Goal: 5 (93/23/55 7322)  Complications: No apparent anesthesia complications

## 2017-04-22 NOTE — Anesthesia Preprocedure Evaluation (Addendum)
Anesthesia Evaluation  Patient identified by MRN, date of birth, ID band Patient awake    Airway Mallampati: II  TM Distance: >3 FB Neck ROM: Full    Dental no notable dental hx. (+) Teeth Intact, Dental Advisory Given,    Pulmonary    Pulmonary exam normal breath sounds clear to auscultation       Cardiovascular Exercise Tolerance: Good hypertension, Pt. on medications Normal cardiovascular exam Rhythm:Regular Rate:Normal     Neuro/Psych Depression    GI/Hepatic   Endo/Other  Hypothyroidism   Renal/GU      Musculoskeletal   Abdominal (+) + obese,   Peds  Hematology   Anesthesia Other Findings   Reproductive/Obstetrics                           Anesthesia Physical Anesthesia Plan  ASA: II  Anesthesia Plan: MAC   Post-op Pain Management:    Induction: Intravenous  PONV Risk Score and Plan: 4 or greater and Ondansetron, Dexamethasone, Propofol, Midazolam and Treatment may vary due to age or medical condition  Airway Management Planned: Simple Face Mask  Additional Equipment:   Intra-op Plan:   Post-operative Plan: Extubation in OR  Informed Consent: I have reviewed the patients History and Physical, chart, labs and discussed the procedure including the risks, benefits and alternatives for the proposed anesthesia with the patient or authorized representative who has indicated his/her understanding and acceptance.   Dental advisory given  Plan Discussed with: CRNA  Anesthesia Plan Comments:        Anesthesia Quick Evaluation

## 2017-04-22 NOTE — Op Note (Signed)
Procedure(s): CONIZATION CERVIX WITH BIOPSY Procedure Note  [name] female 46 y.o. 04/22/2017  Procedure(s) and Anesthesia Type:    * CONIZATION CERVIX WITH BIOPSY - Choice  Surgeon(s) and Role:    * Ioanna Colquhoun, Seymour Bars, MD - Primary   Indications:  Endocervical dysplasia   Surgeon: Kendall Flack P   Assistants: None  Anesthesia: Monitored anesthesia care  ASA Class: 3    Procedure Detail  CONIZATION CERVIX WITH BIOPSY  Findings: Small area of Lugol's nonstaining surrounding the external os of the cervix   Estimated Blood Loss:  Minimal         Drains: None   Blood Given: none          Specimens: Cone biopsy of the cervix. Endocervical curettings         Complications:  None         Disposition: PACU - hemodynamically stable.         Condition: stable   Procedure: The patient was taken to the operating room after appropriate identification and placed on the operating table. Equipment was placed for monitored anesthesia care. The perineum and vagina were prepped with multiple layers of Betadine and a red Robinson catheter used to empty the bladder. The perineum was draped as a sterile field. A gray speculum was placed in the vagina and Lugol's solution, painted on the cervix. The cervix was infiltrated circumferentially with a dilute solution of Pitressin. Holding sutures were placed at the 3 and 9:00 positions and tied down. A cone shaped specimen was then excised from the cervix including the Lugol's nonstaining area and the endocervix. A suture was placed at 12:00 on the specimen. The endocervical canal was curetted with minimal tissue. Sturmdorf sutures were placed at the 1236 and 9:00 positions with excellent hemostasis. A Gelfoam patch. 7. Lidocaine was then placed in the conization bed. The patient was awakened from her anesthetic and taken to the recovery room in satisfactory condition having tolerated procedure well with sponge and instrument counts  correct. The patient will be discharged home after postanesthesia care.

## 2017-04-22 NOTE — Progress Notes (Signed)
Refused Tramadol prescription. Dr. Leo Grosser aware. Will write new prescription soon. Tramadol prescription shredded.

## 2017-04-22 NOTE — Progress Notes (Signed)
Unable to locate patient in department to escort out upon return from another discharge. Instructed to use call light when finished dressing. OR area and waiting room searched for patient as well as main front hallway. Called mother to discover patient had walked out without escorted and they had left the parking lot. Spoke with Elynor to explain practice to escort patient to their ride.

## 2017-04-22 NOTE — Discharge Instructions (Signed)
Cervical Biopsy, Care After Refer to this sheet in the next few weeks. These instructions provide you with information about caring for yourself after your procedure. Your health care provider may also give you more specific instructions. Your treatment has been planned according to current medical practices, but problems sometimes occur. Call your health care provider if you have any problems or questions after your procedure. What can I expect after the procedure? After the procedure, it is common to have:  Cramping or mild pain for a few days.  Slight bleeding from the vagina for a few days.  Dark-colored vaginal discharge for a few days.  Follow these instructions at home:  Take over-the-counter and prescription medicines only as told by your health care provider.  Return to your normal activities as told by your health care provider. Ask your health care provider what activities are safe for you.  Use a sanitary napkin until bleeding and discharge stop.  Do not use tampons until your health care provider approves.  Do not douche until your health care provider approves.  Do not have sex until your health care provider approves.  Keep all follow-up visits as told by your health care provider. This is important. Contact a health care provider if:  You have a fever or chills.  You have bad-smelling vaginal discharge.  You have itching or irritation around the vagina.  You have lower abdominal pain. Get help right away if:  You develop heavy vaginal bleeding that soaks more than one sanitary pad an hour.  You faint.  You have very bad lower abdominal pain. This information is not intended to replace advice given to you by your health care provider. Make sure you discuss any questions you have with your health care provider. Document Released: 06/28/2015 Document Revised: 03/14/2016 Document Reviewed: 02/22/2015 Elsevier Interactive Patient Education  2018 [place] [facility] Home Care Instructions  Activity: Get plenty of rest for the remainder of the day. A responsible individual must stay with you for 24 hours following the procedure.  For the next 24 hours, DO NOT: -Drive a car -Paediatric nurse -Drink alcoholic beverages -Take any medication unless instructed by your physician -Make any legal decisions or sign important papers.  Meals: Start with liquid foods such as gelatin or soup. Progress to regular foods as tolerated. Avoid greasy, spicy, heavy foods. If nausea and/or vomiting occur, drink only clear liquids until the nausea and/or vomiting subsides. Call your physician if vomiting continues.  Special Instructions/Symptoms: Your throat may feel dry or sore from the anesthesia or the breathing tube placed in your throat during surgery. If this causes discomfort, gargle with warm salt water. The discomfort should disappear within 24 hours.  If you had a scopolamine patch placed behind your ear for the management of post- operative nausea and/or vomiting:  1. The medication in the patch is effective for 72 hours, after which it should be removed.  Wrap patch in a tissue and discard in the trash. Wash hands thoroughly with soap and water. 2. You may remove the patch earlier than 72 hours if you experience unpleasant side effects which may include dry mouth, dizziness or visual disturbances. 3. Avoid touching the patch. Wash your hands with soap and water after contact with the patch.    Post Anesthesia Home Care Instructions  Activity: Get plenty of rest for the remainder of the day. A responsible individual must stay with you for 24 hours following the procedure.  For the  next 24 hours, DO NOT: -Drive a car -Paediatric nurse -Drink alcoholic beverages -Take any medication unless instructed by your physician -Make any legal decisions or sign important papers.  Meals: Start with liquid foods such as gelatin or soup.  Progress to regular foods as tolerated. Avoid greasy, spicy, heavy foods. If nausea and/or vomiting occur, drink only clear liquids until the nausea and/or vomiting subsides. Call your physician if vomiting continues.  Special Instructions/Symptoms: Your throat may feel dry or sore from the anesthesia or the breathing tube placed in your throat during surgery. If this causes discomfort, gargle with warm salt water. The discomfort should disappear within 24 hours.  If you had a scopolamine patch placed behind your ear for the management of post- operative nausea and/or vomiting:  1. The medication in the patch is effective for 72 hours, after which it should be removed.  Wrap patch in a tissue and discard in the trash. Wash hands thoroughly with soap and water. 2. You may remove the patch earlier than 72 hours if you experience unpleasant side effects which may include dry mouth, dizziness or visual disturbances. 3. Avoid touching the patch. Wash your hands with soap and water after contact with the patch.    Post Anesthesia Home Care Instructions  Activity: Get plenty of rest for the remainder of the day. A responsible individual must stay with you for 24 hours following the procedure.  For the next 24 hours, DO NOT: -Drive a car -Paediatric nurse -Drink alcoholic beverages -Take any medication unless instructed by your physician -Make any legal decisions or sign important papers.  Meals: Start with liquid foods such as gelatin or soup. Progress to regular foods as tolerated. Avoid greasy, spicy, heavy foods. If nausea and/or vomiting occur, drink only clear liquids until the nausea and/or vomiting subsides. Call your physician if vomiting continues.  Special Instructions/Symptoms: Your throat may feel dry or sore from the anesthesia or the breathing tube placed in your throat during surgery. If this causes discomfort, gargle with warm salt water. The discomfort should disappear within 24  hours.  If you had a scopolamine patch placed behind your ear for the management of post- operative nausea and/or vomiting:  1. The medication in the patch is effective for 72 hours, after which it should be removed.  Wrap patch in a tissue and discard in the trash. Wash hands thoroughly with soap and water. 2. You may remove the patch earlier than 72 hours if you experience unpleasant side effects which may include dry mouth, dizziness or visual disturbances. 3. Avoid touching the patch. Wash your hands with soap and water after contact with the patch.

## 2017-04-23 ENCOUNTER — Ambulatory Visit (INDEPENDENT_AMBULATORY_CARE_PROVIDER_SITE_OTHER): Payer: Self-pay | Admitting: Family Medicine

## 2017-04-23 VITALS — BP 114/70 | HR 62 | Temp 98.2°F | Resp 16 | Ht 62.5 in | Wt 184.4 lb

## 2017-04-23 DIAGNOSIS — T148XXA Other injury of unspecified body region, initial encounter: Secondary | ICD-10-CM

## 2017-04-23 MED ORDER — AMOXICILLIN-POT CLAVULANATE 875-125 MG PO TABS: 1.0000 | ORAL_TABLET | Freq: Two times a day (BID) | ORAL | 0 refills | Status: DC

## 2017-04-23 MED ORDER — FLUCONAZOLE 150 MG PO TABS: 150.0000 mg | ORAL_TABLET | ORAL | 1 refills | Status: DC | PRN

## 2017-04-23 NOTE — Progress Notes (Signed)
Subjective:  [name] is a 46 y.o. female who presents for evaluation of a dog bite.  The patient denies fever, chills and cold sweats. She was clipping her dog's nails when he bit her on her chin and her upper bridge of her nose. She cleansed wounds with alcohol and came immediately here to clinic. Reports that her period is up to date on all vaccinations  and is routinely non aggressive.  ROS See history of present illness  Objective:  Physical Exam  HENT:  Head:    Cardiovascular: Normal rate.   Pulmonary/Chest: Effort normal.  Skin: Skin is warm and dry.  Psychiatric: She has a normal mood and affect. Her behavior is normal. Judgment and thought content normal.   Vitals:   04/23/17 1217  BP: 114/70  Pulse: 62  Resp: 16  Temp: 98.2 F (36.8 C)   Procedure Note: Wound was cleansed with soap and water. Irrigated with soap and water 3 to the lower chin. Cleansed bridge of nose wound and chin wound with Betadine. Chin wound was closed with Derma bond. Patient remained in office 20 minutes after application of Dermabond and was absent of any skin irritation or reaction. Tolerated procedure.  Assessment:  Dog bite-Dog is unknown by patient, up-to-date on all vaccinations including rabies. Nonpenetrating, superficial wound on the upper bridge nose and partially penetrating wound on the lower chin. No evidence of infection. Localized erythema only.   Plan:  -Keep wound open to air.  -Education provided on signs of worsening infection.  Meds ordered this encounter  Medications  . buPROPion (WELLBUTRIN XL) 150 MG 24 hr tablet    Sig: Take 150 mg by mouth every morning.  Marland Kitchen amoxicillin-clavulanate (AUGMENTIN) 875-125 MG tablet    Sig: Take 1 tablet by mouth 2 (two) times daily.    Dispense:  20 tablet    Refill:  0  . fluconazole (DIFLUCAN) 150 MG tablet    Sig: Take 1 tablet (150 mg total) by mouth every three (3) days as needed.    Dispense:  3 tablet    Refill:   1    Return for follow-up or follow-up with your PCP if you experience any worsening of symptoms.  Carroll Sage. Kenton Kingfisher, MSN, FNP-C,  [place], [address] [place], [place] [address]  [phone]

## 2017-04-23 NOTE — Patient Instructions (Addendum)
Start Augmentin today 1 tablet with food twice daily for 10 days.   Complete all medication.   For yeast infection prevention, take 1 dose of Diflucan today, you may repeat every 3 days as needed until completion of antibiotic.     Animal Bite Animal bite wounds can get infected. It is important to get proper medical treatment. Ask your doctor if you need rabies treatment. Follow these instructions at home: Wound care  Follow instructions from your doctor about how to take care of your wound. Make sure you: ? Wash your hands with soap and water before you change your bandage (dressing). If you cannot use soap and water, use hand sanitizer. ? Change your bandage as told by your doctor. ? Leave stitches (sutures), skin glue, or skin tape (adhesive) strips in place. They may need to stay in place for 2 weeks or longer. If tape strips get loose and curl up, you may trim the loose edges. Do not remove tape strips completely unless your doctor says it is okay.  Check your wound every day for signs of infection. Watch for: ? Redness, swelling, or pain that gets worse. ? Fluid, blood, or pus. General instructions  Take or apply over-the-counter and prescription medicines only as told by your doctor.  If you were prescribed an antibiotic, take or apply it as told by your doctor. Do not stop using the antibiotic even if your condition improves.  Keep the injured area raised (elevated) above the level of your heart while you are sitting or lying down.  If directed, apply ice to the injured area. ? Put ice in a plastic bag. ? Place a towel between your skin and the bag. ? Leave the ice on for 20 minutes, 2-3 times per day.  Keep all follow-up visits as told by your doctor. This is important. Contact a doctor if:  You have redness, swelling, or pain that gets worse.  You have a general feeling of sickness (malaise).  You feel sick to your stomach (nauseous).  You throw up  (vomit).  You have pain that does not get better. Get help right away if:  You have a red streak going away from your wound.  You have fluid, blood, or pus coming from your wound.  You have a fever or chills.  You have trouble moving your injured area.  You have numbness or tingling anywhere on your body. This information is not intended to replace advice given to you by your health care provider. Make sure you discuss any questions you have with your health care provider. Document Released: 10/07/2005 Document Revised: 03/14/2016 Document Reviewed: 02/22/2015 Elsevier Interactive Patient Education  2018 [employer].   Tdap Vaccine (Tetanus, Diphtheria and Pertussis): What You Need to Know  1. Why get vaccinated? Tetanus, diphtheria and pertussis are very serious diseases. Tdap vaccine can protect [place] from these diseases. And, Tdap vaccine given to pregnant women can protect newborn babies against pertussis. TETANUS (Lockjaw) is rare in the [place] today. It causes painful muscle tightening and stiffness, usually all over the body.  It can lead to tightening of muscles in the head and neck so you can't open your mouth, swallow, or sometimes even breathe. Tetanus kills about 1 out of 10 people who are infected even after receiving the best medical care.  DIPHTHERIA is also rare in the [place] today. It can cause a thick coating to form in the back of the throat.  It can lead to  breathing problems, heart failure, paralysis, and death.  PERTUSSIS (Whooping Cough) causes severe coughing spells, which can cause difficulty breathing, vomiting and disturbed sleep.  It can also lead to weight loss, incontinence, and rib fractures. Up to 2 in 100 adolescents and 5 in 100 adults with pertussis are hospitalized or have complications, which could include pneumonia or death.  These diseases are caused by bacteria. Diphtheria and pertussis are spread from person to person through  secretions from coughing or sneezing. Tetanus enters the body through cuts, scratches, or wounds. Before vaccines, as many as 200,000 cases of diphtheria, 200,000 cases of pertussis, and hundreds of cases of tetanus, were reported in the [place] each year. Since vaccination began, reports of cases for tetanus and diphtheria have dropped by about 99% and for pertussis by about 80%. 2. Tdap vaccine Tdap vaccine can protect adolescents and adults from tetanus, diphtheria, and pertussis. One dose of Tdap is routinely given at age 26 or 44. People who did not get Tdap at that age should get it as soon as possible. Tdap is especially important for healthcare professionals and anyone having close contact with a baby younger than 12 months. Pregnant women should get a dose of Tdap during every pregnancy, to protect the newborn from pertussis. Infants are most at risk for severe, life-threatening complications from pertussis. Another vaccine, called Td, protects against tetanus and diphtheria, but not pertussis. A Td booster should be given every 10 years. Tdap may be given as one of these boosters if you have never gotten Tdap before. Tdap may also be given after a severe cut or burn to prevent tetanus infection. Your doctor or the person giving you the vaccine can give you more information. Tdap may safely be given at the same time as other vaccines. 3. Some people should not get this vaccine  A person who has ever had a life-threatening allergic reaction after a previous dose of any diphtheria, tetanus or pertussis containing vaccine, OR has a severe allergy to any part of this vaccine, should not get Tdap vaccine. Tell the person giving the vaccine about any severe allergies.  Anyone who had coma or long repeated seizures within 7 days after a childhood dose of DTP or DTaP, or a previous dose of Tdap, should not get Tdap, unless a cause other than the vaccine was found. They can still get Td.  Talk  to your doctor if you: ? have seizures or another nervous system problem, ? had severe pain or swelling after any vaccine containing diphtheria, tetanus or pertussis, ? ever had a condition called Guillain-Barr Syndrome (GBS), ? aren't feeling well on the day the shot is scheduled. 4. Risks With any medicine, including vaccines, there is a chance of side effects. These are usually mild and go away on their own. Serious reactions are also possible but are rare. Most people who get Tdap vaccine do not have any problems with it. Mild problems following Tdap: (Did not interfere with activities)  Pain where the shot was given (about 3 in 4 adolescents or 2 in 3 adults)  Redness or swelling where the shot was given (about 1 person in 5)  Mild fever of at least 100.23F (up to about 1 in 25 adolescents or 1 in 100 adults)  Headache (about 3 or 4 people in 10)  Tiredness (about 1 person in 3 or 4)  Nausea, vomiting, diarrhea, stomach ache (up to 1 in 4 adolescents or 1 in 10 adults)  Chills,  sore joints (about 1 person in 10)  Body aches (about 1 person in 3 or 4)  Rash, swollen glands (uncommon)  Moderate problems following Tdap: (Interfered with activities, but did not require medical attention)  Pain where the shot was given (up to 1 in 5 or 6)  Redness or swelling where the shot was given (up to about 1 in 16 adolescents or 1 in 12 adults)  Fever over 102F (about 1 in 100 adolescents or 1 in 250 adults)  Headache (about 1 in 7 adolescents or 1 in 10 adults)  Nausea, vomiting, diarrhea, stomach ache (up to 1 or 3 people in 100)  Swelling of the entire arm where the shot was given (up to about 1 in 500).  Severe problems following Tdap: (Unable to perform usual activities; required medical attention)  Swelling, severe pain, bleeding and redness in the arm where the shot was given (rare).  Problems that could happen after any vaccine:  People sometimes faint after a  medical procedure, including vaccination. Sitting or lying down for about 15 minutes can help prevent fainting, and injuries caused by a fall. Tell your doctor if you feel dizzy, or have vision changes or ringing in the ears.  Some people get severe pain in the shoulder and have difficulty moving the arm where a shot was given. This happens very rarely.  Any medication can cause a severe allergic reaction. Such reactions from a vaccine are very rare, estimated at fewer than 1 in a million doses, and would happen within a few minutes to a few hours after the vaccination. As with any medicine, there is a very remote chance of a vaccine causing a serious injury or death. The safety of vaccines is always being monitored. For more information, visit: http://www.aguilar.org/ 5. What if there is a serious problem? What should I look for? Look for anything that concerns you, such as signs of a severe allergic reaction, very high fever, or unusual behavior. Signs of a severe allergic reaction can include hives, swelling of the face and throat, difficulty breathing, a fast heartbeat, dizziness, and weakness. These would usually start a few minutes to a few hours after the vaccination. What should I do?  If you think it is a severe allergic reaction or other emergency that can't wait, call 9-1-1 or get the person to the nearest hospital. Otherwise, call your doctor.  Afterward, the reaction should be reported to the Vaccine Adverse Event Reporting System (VAERS). Your doctor might file this report, or you can do it yourself through the VAERS web site at www.vaers.SamedayNews.es, or by calling 917-653-9984. ? VAERS does not give medical advice. 6. The National Vaccine Injury Compensation Program The [employer] Vaccine Injury Compensation Program (VICP) is a federal program that was created to compensate people who may have been injured by certain vaccines. Persons who believe they may have been injured by a  vaccine can learn about the program and about filing a claim by calling 437-343-8784 or visiting the [facility] website at GoldCloset.com.ee. There is a time limit to file a claim for compensation. 7. How can I learn more?  Ask your doctor. He or she can give you the vaccine package insert or suggest other sources of information.  Call your local or state health department.  Contact the Centers for Disease Control and Prevention (CDC): ? Call 919-270-0521 (1-800-CDC-INFO) or ? Visit CDC's website at http://hunter.com/ CDC Tdap Vaccine VIS (12/14/13) This information is not intended to replace advice given to  you by your health care provider. Make sure you discuss any questions you have with your health care provider. Document Released: 04/07/2012 Document Revised: 06/27/2016 Document Reviewed: 06/27/2016 Elsevier Interactive Patient Education  2017 [employer].

## 2017-04-24 ENCOUNTER — Encounter (HOSPITAL_BASED_OUTPATIENT_CLINIC_OR_DEPARTMENT_OTHER): Payer: Self-pay | Admitting: Obstetrics and Gynecology

## 2017-07-21 DIAGNOSIS — R1084 Generalized abdominal pain: Secondary | ICD-10-CM | POA: Insufficient documentation

## 2017-10-10 ENCOUNTER — Ambulatory Visit: Payer: BLUE CROSS/BLUE SHIELD | Admitting: Cardiovascular Disease

## 2017-10-10 ENCOUNTER — Encounter: Payer: Self-pay | Admitting: Cardiovascular Disease

## 2017-10-10 VITALS — BP 139/89 | HR 69 | Ht 62.5 in | Wt 191.6 lb

## 2017-10-10 DIAGNOSIS — I10 Essential (primary) hypertension: Secondary | ICD-10-CM

## 2017-10-10 DIAGNOSIS — E7800 Pure hypercholesterolemia, unspecified: Secondary | ICD-10-CM

## 2017-10-10 NOTE — Progress Notes (Signed)
10/10/2017 [name]   23-Apr-1971  [id number]  Primary Physician Leighton Ruff, MD Primary Cardiologist: Lorretta Harp MD Lupe Carney, [place]  HPI:  [name] is a 46 y.o.  moderately overweight, divorced Caucasian female, mother of 1 whose mother is a patient As was her father before he died 06-06-2014. I last saw her in the office 10/04/16. She has a history of hypertension, hyperlipidemia and positive family history for heart disease. She had a CT angiogram at [facility] which revealed mild to moderate CAD with a calcium score of 130. She does complain of chest pain and shortness of breath periodically. She had a Myoview stress test performed in our office October 02, 2012, which was entirely normal. Since I saw her back a year ago she's developed aggressive dyspnea and substernal chest pressure. She saw [name] [facility] in the office 09/07/14 who ordered a Myoview stress test that was done in 09/22/14 revealing mild ischemia in the LAD territory versus breast attenuation artifact. I performed cardiac catheterization on her via the right radial approach 10/04/14 which was entirely normal. 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 Since I saw her in the office a year ago she denies chest pain or shortness of breath.   Current Meds  Medication Sig  . atenolol (TENORMIN) 25 MG tablet take 1 tablet by mouth at bedtime  . atorvastatin (LIPITOR) 40 MG tablet take 1 tablet by mouth once daily (Patient taking differently: take 1 tablet by mouth once daily--- takes in pm)  . buPROPion (WELLBUTRIN XL) 150 MG 24 hr tablet Take 150 mg by mouth every morning.  . etonogestrel-ethinyl estradiol (NUVARING) 0.12-0.015 MG/24HR vaginal ring Place 1 each vaginally every 28 (twenty-eight) days. Insert vaginally and leave in place for 3 consecutive weeks, then remove for 1 week.  . levothyroxine (SYNTHROID, LEVOTHROID) 25 MCG tablet Take 25 mcg by mouth  daily before breakfast.   . linaclotide (LINZESS) 145 MCG CAPS capsule Take 145 mcg by mouth daily.  . Multiple Vitamin (MULTIVITAMIN) capsule Take 1 capsule by mouth 3 (three) times daily.   . naltrexone (DEPADE) 50 MG tablet Take 50 mg by mouth as needed.   Marland Kitchen omeprazole (PRILOSEC) 40 MG capsule Take 40 mg by mouth daily.  . Vilazodone HCl (VIIBRYD) 40 MG TABS Take 40 mg by mouth daily after breakfast.      No Known Allergies  Social History   Socioeconomic History  . Marital status: Divorced    Spouse name: Not on file  . Number of children: Not on file  . Years of education: Not on file  . Highest education level: Not on file  Social Needs  . Financial resource strain: Not on file  . Food insecurity - worry: Not on file  . Food insecurity - inability: Not on file  . Transportation needs - medical: Not on file  . Transportation needs - non-medical: Not on file  Occupational History  . Not on file  Tobacco Use  . Smoking status: Never Smoker  . Smokeless tobacco: Never Used  Substance and Sexual Activity  . Alcohol use: No  . Drug use: No  . Sexual activity: Yes    Birth control/protection: Inserts    Comment: nuvaring  Other Topics Concern  . Not on file  Social History Narrative  . Not on file     Review of Systems: General: negative for chills, fever, night sweats or weight changes.  Cardiovascular: negative for chest pain, dyspnea on exertion, edema, orthopnea, palpitations, paroxysmal nocturnal dyspnea or shortness of breath Dermatological: negative for rash Respiratory: negative for cough or wheezing Urologic: negative for hematuria Abdominal: negative for nausea, vomiting, diarrhea, bright red blood per rectum, melena, or hematemesis Neurologic: negative for visual changes, syncope, or dizziness All other systems reviewed and are otherwise negative except as noted above.    Blood pressure 139/89, pulse 69, height 5' 2.5" (1.588 m), weight 191 lb 9.6 oz  (86.9 kg).  General appearance: alert and no distress Neck: no adenopathy, no carotid bruit, no JVD, supple, symmetrical, trachea midline and thyroid not enlarged, symmetric, no tenderness/mass/nodules Lungs: clear to auscultation bilaterally Heart: regular rate and rhythm, S1, S2 normal, no murmur, click, rub or gallop Extremities: extremities normal, atraumatic, no cyanosis or edema Pulses: 2+ and symmetric Skin: Skin color, texture, turgor normal. No rashes or lesions Neurologic: Alert and oriented X 3, normal strength and tone. Normal symmetric reflexes. Normal coordination and gait  EKG sinus rhythm of 69 without ST or T-wave changes. I personally reviewed his EKG.  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  Essential hypertension History of essential hypertension blood pressure measured 139/89. She is on atenolol. Continue current meds at current dosing.      Lorretta Harp MD FACP,FACC,FAHA, [facility] 10/10/2017 11:14 AM

## 2017-10-10 NOTE — Patient Instructions (Signed)
Medication Instructions: Your physician recommends that you continue on your current medications as directed. Please refer to the Current Medication list given to you today.   Follow-Up: Your physician recommends that you schedule a follow-up appointment as needed with Dr. Berry.    

## 2017-10-10 NOTE — Assessment & Plan Note (Signed)
History of essential hypertension blood pressure measured 139/89. She is on atenolol. Continue current meds at current dosing.

## 2017-10-10 NOTE — Assessment & Plan Note (Signed)
History of essentially a clean quart cardiac catheterization performed by myself 10/04/14 after a Myoview stress test showed mild ischemia in the LAD territory.

## 2017-10-10 NOTE — Assessment & Plan Note (Signed)
History of hyperlipidemia on statin therapy followed by her PCP. 

## 2017-10-16 ENCOUNTER — Other Ambulatory Visit: Payer: Self-pay | Admitting: Family Medicine

## 2017-10-16 DIAGNOSIS — Z1231 Encounter for screening mammogram for malignant neoplasm of breast: Secondary | ICD-10-CM

## 2017-10-26 ENCOUNTER — Telehealth: Payer: Self-pay

## 2017-10-26 NOTE — Telephone Encounter (Signed)
Patient called in requesting verification of Tdap immunization on 04/23/2017.   Advised patient she did receive a Tdap on 04/23/2017.   Patient stated she understood and just wanted to verify and confirm she had.

## 2017-11-07 ENCOUNTER — Ambulatory Visit: Payer: BLUE CROSS/BLUE SHIELD

## 2017-11-25 ENCOUNTER — Other Ambulatory Visit: Payer: Self-pay | Admitting: Cardiovascular Disease

## 2018-03-15 ENCOUNTER — Other Ambulatory Visit: Payer: Self-pay | Admitting: Cardiovascular Disease

## 2018-03-17 NOTE — Telephone Encounter (Signed)
Rx sent to pharmacy   

## 2018-05-01 ENCOUNTER — Ambulatory Visit (INDEPENDENT_AMBULATORY_CARE_PROVIDER_SITE_OTHER): Payer: BLUE CROSS/BLUE SHIELD | Admitting: Orthopedic Surgery

## 2018-05-01 ENCOUNTER — Encounter (INDEPENDENT_AMBULATORY_CARE_PROVIDER_SITE_OTHER): Payer: Self-pay | Admitting: Orthopedic Surgery

## 2018-05-01 DIAGNOSIS — M7711 Lateral epicondylitis, right elbow: Secondary | ICD-10-CM | POA: Diagnosis not present

## 2018-05-01 MED ORDER — DICLOFENAC SODIUM 2 % TD SOLN: 2.0000 | Freq: Two times a day (BID) | TRANSDERMAL | 1 refills | Status: DC

## 2018-05-03 NOTE — Progress Notes (Signed)
Office Visit Note   Patient: [name]           Date of Birth: 08-05-1971           MRN: [id number] Visit Date: 05/01/2018 Requested by: Leighton Ruff, MD [place], [place] [address] PCP: Leighton Ruff, MD  Subjective: Chief Complaint  Patient presents with  . Right Elbow - Pain    HPI: [name] is a patient with right elbow pain 2 weeks duration.  She started tennis lessons at that time.  Has had some dry needling with some relief.  Ibuprofen has not helped her.  Pain even when lifting a cup.  She also did have a dog bite on the right forearm in January which has healed.  She was able to undergo to tennis lessons but on the third less than her arm became significantly more painful.  She localizes the pain discretely to the lateral epicondyle and not really in the mobile wad area.  Denies much in the way of neck pain or numbness and tingling.              ROS: All systems reviewed are negative as they relate to the chief complaint within the history of present illness.  Patient denies  fevers or chills.   Assessment & Plan: Visit Diagnoses:  1. Lateral epicondylitis, right elbow     Plan: Impression is right elbow common extensor origin tendinosis.  Ultrasound evaluation straits no focal tearing or structural damage.  She does have a very small bony ridge at that lateral epicondyle region.  Plan is topical anti-inflammatory plus continued therapy stretching tennis elbow strap for activities of daily living.  I do not think tennis itself is a great idea at this time until this is improved.  We talked about a cortisone injection today which could be considered but for symptoms of only 1 to 2 weeks I would try these other measures first.  We will check her back in 6 weeks for clinical recheck and consideration of injection.  I did tell her that the current data suggest that injection relief 3 to 4 months.  The reason to do that would be  Follow-Up  Instructions: Return in about 6 weeks (around 06/12/2018).   Orders:  No orders of the defined types were placed in this encounter.  Meds ordered this encounter  Medications  . Diclofenac Sodium (PENNSAID) 2 % SOLN    Sig: Place 2 Squirts onto the skin 2 (two) times daily.    Dispense:  1 Bottle    Refill:  1      Procedures: No procedures performed   Clinical Data: No additional findings.  Objective: Vital Signs: There were no vitals taken for this visit.  Physical Exam:   Constitutional: Patient appears well-developed HEENT:  Head: Normocephalic Eyes:EOM are normal Neck: Normal range of motion Cardiovascular: Normal rate Pulmonary/chest: Effort normal Neurologic: Patient is alert Skin: Skin is warm Psychiatric: Patient has normal mood and affect    Ortho Exam: Ortho exam demonstrates good cervical spine range of motion.  Patient does have some pain with squeezing and gripping on the right side but not the left.  Radial pulse is intact bilaterally.  Patient does have pain on the right-hand side with resisted finger extension and resisted wrist extension all of which localized to the lateral epicondyle region.  Ulnar nerve does not subluxate.  Patient has tenderness to palpation over the lateral epicondyle but not on the radial tunnel supinator  region  Specialty Comments:  No specialty comments available.  Imaging: No results found.   PMFS History: Patient Active Problem List   Diagnosis Date Noted  . PMDD (premenstrual dysphoric disorder) 04/22/2017  . Abnormal Pap smear of cervix 04/22/2017  . Pain in left ankle and joints of left foot 11/29/2016  . Radiculopathy due to lumbar intervertebral disc disorder 10/02/2016  . Family history of coronary artery disease 09/07/2014  . DOE (dyspnea on exertion) 09/07/2014  . Hypothyroid 09/07/2014  . Back pain 09/07/2014  . Excessive sweating 08/04/2014  . Essential hypertension 10/18/2013  . CAD (coronary artery  disease), non obstrustive by cardiac CT 07/28/2012  . Chest pain with moderate risk of acute coronary syndrome 07/27/2012  . Hyperlipidemia 07/27/2012  . Menopausal symptom 02/06/2012  . Fatigue 02/06/2012  . Morbid obesity ([place]) 02/06/2012  . Bariatric surgery status 02/06/2012   Past Medical History:  Diagnosis Date  . Abnormal cytological finding in specimen from cervix   . Chronic constipation   . Complication of anesthesia    post-op  urinary retention (per pt happens after every surgery)  . Family history of coronary artery disease   . History of gastric ulcer   . Hyperlipidemia   . Hypertension   . Hypothyroidism   . 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  at  [place]  . Wears contact lenses     Family History  Problem Relation Age of Onset  . Diabetes Paternal Grandfather   . Breast cancer Paternal Grandmother   . Breast cancer Maternal Grandmother   . Diabetes Father   . Coronary artery disease Father   . Coronary artery disease Mother     Past Surgical History:  Procedure Laterality Date  . ANAL FISSURE REPAIR  07/10/2000  . BUNIONECTOMY Right 2004  . CARDIOVASCULAR STRESS TEST  09-21-2014  dr berry   nuclear study w/ a reversible anterior wall perfusion abnormality in the distribution of the LAD artery, consistant with ischemia (this was a 2-day study in a marked breast attenutation artifact, high likeihood of "shifting breast artifact")/  normal LV function and wall motion , ef 68%  . CARPAL TUNNEL RELEASE Bilateral early 2000s  . CERVICAL CONIZATION W/BX N/A 04/22/2017   Procedure: CONIZATION CERVIX WITH BIOPSY;  Surgeon: Eldred Manges, MD;  Location: [facility];  Service: Gynecology;  Laterality: N/A;  . COMBINED REDUCTION MAMMAPLASTY W/ ABDOMINOPLASTY  04/2009  . LAPAROSCOPIC CHOLECYSTECTOMY  08/12/2003  . LEFT HEART CATHETERIZATION WITH CORONARY ANGIOGRAM N/A 10/03/2014   Procedure: LEFT HEART CATHETERIZATION WITH CORONARY ANGIOGRAM;   Surgeon: Lorretta Harp, MD;  Location: [facility] CATH LAB;  Service: Cardiovascular;  Laterality: N/A;  essentially normal coronary arteries and normal LVF (false postitive stress test and her symptoms noncardiac)  . NASAL SEPTOPLASTY W/ TURBINOPLASTY  07/14/2006  . REPAIR PERIUMBILICAL HERNIA W/ MESH  03/21/2007  . ROUX-EN-Y GASTRIC BYPASS  07-18-2015    [facility]   via Laparoscopy  . TRANSTHORACIC ECHOCARDIOGRAM  09-21-2014   dr berry   grade 2 diastolic dysfunction, ef 29-47%/  trivial MR and TR   Social History   Occupational History  . Not on file  Tobacco Use  . Smoking status: Never Smoker  . Smokeless tobacco: Never Used  Substance and Sexual Activity  . Alcohol use: No  . Drug use: No  . Sexual activity: Yes    Birth control/protection: Inserts    Comment: nuvaring

## 2018-05-14 ENCOUNTER — Telehealth (INDEPENDENT_AMBULATORY_CARE_PROVIDER_SITE_OTHER): Payer: Self-pay | Admitting: Orthopedic Surgery

## 2018-05-14 NOTE — Telephone Encounter (Signed)
Patient left a message this morning in regards to her [place].  She stated that the mail order pharmacy is on back order for the medication and wanted to know if we had samples that we could give her until she can get the medication from the pharmacy.  CB#[phone].  Thank you.

## 2018-05-14 NOTE — Telephone Encounter (Signed)
IC advised sample put up front

## 2018-06-16 ENCOUNTER — Other Ambulatory Visit: Payer: Self-pay | Admitting: Cardiovascular Disease

## 2018-06-19 DIAGNOSIS — R635 Abnormal weight gain: Secondary | ICD-10-CM | POA: Insufficient documentation

## 2018-06-23 ENCOUNTER — Telehealth (INDEPENDENT_AMBULATORY_CARE_PROVIDER_SITE_OTHER): Payer: Self-pay | Admitting: Orthopedic Surgery

## 2018-06-23 NOTE — Telephone Encounter (Signed)
Returned call to patient left message for return call   [phone]

## 2018-07-31 ENCOUNTER — Ambulatory Visit (INDEPENDENT_AMBULATORY_CARE_PROVIDER_SITE_OTHER): Payer: BLUE CROSS/BLUE SHIELD | Admitting: Orthopedic Surgery

## 2018-07-31 ENCOUNTER — Encounter (INDEPENDENT_AMBULATORY_CARE_PROVIDER_SITE_OTHER): Payer: Self-pay | Admitting: Orthopedic Surgery

## 2018-07-31 DIAGNOSIS — M7711 Lateral epicondylitis, right elbow: Secondary | ICD-10-CM | POA: Diagnosis not present

## 2018-07-31 MED ORDER — BUPIVACAINE HCL 0.5 % IJ SOLN
0.6600 mL | INTRAMUSCULAR | Status: AC | PRN
  Administered 2018-07-31: .66 mL

## 2018-07-31 MED ORDER — METHYLPREDNISOLONE ACETATE 40 MG/ML IJ SUSP
13.3300 mg | INTRAMUSCULAR | Status: AC | PRN
  Administered 2018-07-31: 13.33 mg

## 2018-07-31 MED ORDER — LIDOCAINE HCL 1 % IJ SOLN
3.0000 mL | INTRAMUSCULAR | Status: AC | PRN
Start: 2018-07-31 — End: 2018-07-31
  Administered 2018-07-31: 3 mL

## 2018-07-31 NOTE — Progress Notes (Signed)
The   Office Visit Note   Patient: [name]           Date of Birth: 14-Jul-1971           MRN: [id number] Visit Date: 07/31/2018 Requested by: Rebecca Ruff, MD [place], [place] [address] PCP: Rebecca Ruff, MD  Subjective: Chief Complaint  Patient presents with  . Right Elbow - Pain    HPI: [name] is a patient with right elbow lateral doubt tendinosis.  Since I seen her she is lost her job.  Not any better with topical.  The less typing she is done the better she has felt.  She is not taking any oral medication.              ROS: All systems reviewed are negative as they relate to the chief complaint within the history of present illness.  Patient denies  fevers or chills.   Assessment & Plan: Visit Diagnoses:  1. Lateral epicondylitis, right elbow     Plan: Impression is right elbow lateral epicondylitis plan is injection today.  Did discuss with her that this would give her perhaps at most 4 to 6 months of relief.  Patient understands.  We will see her back.  As needed.  Follow-Up Instructions: Return if symptoms worsen or fail to improve.   Orders:  No orders of the defined types were placed in this encounter.  No orders of the defined types were placed in this encounter.     Procedures: Hand/UE Inj: R elbow for lateral epicondylitis on 07/31/2018 1:37 PM Indications: therapeutic Details: 22 G needle, lateral approach Medications: 3 mL lidocaine 1 %; 0.66 mL bupivacaine 0.5 %; 13.33 mg methylPREDNISolone acetate 40 MG/ML Outcome: tolerated well, no immediate complications Procedure, treatment alternatives, risks and benefits explained, specific risks discussed. Immediately prior to procedure a time out was called to verify the correct patient, procedure, equipment, support staff and site/side marked as required. Patient was prepped and draped in the usual sterile fashion.       Clinical Data: No additional  findings.  Objective: Vital Signs: There were no vitals taken for this visit.  Physical Exam:   Constitutional: Patient appears well-developed HEENT:  Head: Normocephalic Eyes:EOM are normal Neck: Normal range of motion Cardiovascular: Normal rate Pulmonary/chest: Effort normal Neurologic: Patient is alert Skin: Skin is warm Psychiatric: Patient has normal mood and affect    Ortho Exam: Ortho exam demonstrates tenderness at the lateral epicondyle with resisted wrist extension and finger extension.  No radial tunnel tenderness.  Grip strength is intact.  No other masses lymphadenopathy or skin changes noted in that elbow region.  Elbow range of motion is full  Specialty Comments:  No specialty comments available.  Imaging: No results found.   PMFS History: Patient Active Problem List   Diagnosis Date Noted  . PMDD (premenstrual dysphoric disorder) 04/22/2017  . Abnormal Pap smear of cervix 04/22/2017  . Pain in left ankle and joints of left foot 11/29/2016  . Radiculopathy due to lumbar intervertebral disc disorder 10/02/2016  . Family history of coronary artery disease 09/07/2014  . DOE (dyspnea on exertion) 09/07/2014  . Hypothyroid 09/07/2014  . Back pain 09/07/2014  . Excessive sweating 08/04/2014  . Essential hypertension 10/18/2013  . 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 Chest pain with moderate risk of acute coronary syndrome 07/27/2012  . Hyperlipidemia 07/27/2012  . Menopausal symptom 02/06/2012  . Fatigue 02/06/2012  . Morbid  obesity ([place]) 02/06/2012  . Bariatric surgery status 02/06/2012   Past Medical History:  Diagnosis Date  . Abnormal cytological finding in specimen from cervix   . Chronic constipation   . Complication of anesthesia    post-op  urinary retention (per pt happens after every surgery)  . Family history of coronary artery disease   . History of gastric ulcer   . Hyperlipidemia   . Hypertension    . Hypothyroidism   . 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  at  [place]  . Wears contact lenses     Family History  Problem Relation Age of Onset  . Diabetes Paternal Grandfather   . Breast cancer Paternal Grandmother   . Breast cancer Maternal Grandmother   . Diabetes Father   . Coronary artery disease Father   . Coronary artery disease Mother     Past Surgical History:  Procedure Laterality Date  . ANAL FISSURE REPAIR  07/10/2000  . BUNIONECTOMY Right 2004  . CARDIOVASCULAR STRESS TEST  09-21-2014  dr berry   nuclear study w/ a reversible anterior wall perfusion abnormality in the distribution of the LAD artery, consistant with ischemia (this was a 2-day study in a marked breast attenutation artifact, high likeihood of "shifting breast artifact")/  normal LV function and wall motion , ef 68%  . CARPAL TUNNEL RELEASE Bilateral early 2000s  . CERVICAL CONIZATION W/BX N/A 04/22/2017   Procedure: CONIZATION CERVIX WITH BIOPSY;  Surgeon: Eldred Manges, MD;  Location: [place];  Service: Gynecology;  Laterality: N/A;  . COMBINED REDUCTION MAMMAPLASTY W/ ABDOMINOPLASTY  04/2009  . LAPAROSCOPIC CHOLECYSTECTOMY  08/12/2003  . LEFT HEART CATHETERIZATION WITH CORONARY ANGIOGRAM N/A 10/03/2014   Procedure: LEFT HEART CATHETERIZATION WITH CORONARY ANGIOGRAM;  Surgeon: Lorretta Harp, MD;  Location: [facility] CATH LAB;  Service: Cardiovascular;  Laterality: N/A;  essentially normal coronary arteries and normal LVF (false postitive stress test and her symptoms noncardiac)  . NASAL SEPTOPLASTY W/ TURBINOPLASTY  07/14/2006  . REPAIR PERIUMBILICAL HERNIA W/ MESH  03/21/2007  . ROUX-EN-Y GASTRIC BYPASS  07-18-2015    [facility]   via Laparoscopy  . TRANSTHORACIC ECHOCARDIOGRAM  09-21-2014   dr berry   grade 2 diastolic dysfunction, ef 52-08%/  trivial MR and TR   Social History   Occupational History  . Not on file  Tobacco Use  . Smoking status: Never Smoker  . Smokeless  tobacco: Never Used  Substance and Sexual Activity  . Alcohol use: No  . Drug use: No  . Sexual activity: Yes    Birth control/protection: Inserts    Comment: nuvaring

## 2018-12-06 ENCOUNTER — Other Ambulatory Visit: Payer: Self-pay | Admitting: Cardiovascular Disease

## 2019-03-02 ENCOUNTER — Other Ambulatory Visit: Payer: Self-pay | Admitting: Cardiovascular Disease

## 2019-03-03 ENCOUNTER — Telehealth: Payer: Self-pay | Admitting: Cardiovascular Disease

## 2019-03-03 MED ORDER — ATORVASTATIN CALCIUM 40 MG PO TABS: 40.0000 mg | ORAL_TABLET | Freq: Every day | ORAL | 0 refills | Status: AC

## 2019-03-03 MED ORDER — ATENOLOL 25 MG PO TABS: 25.0000 mg | ORAL_TABLET | Freq: Every day | ORAL | 0 refills | Status: AC

## 2019-03-03 NOTE — Telephone Encounter (Signed)
Pt requesting refill on meds listed below Per pt has to switch providers due to insurance and has appt in [place] with [facility] MD Pt needs 1 last refill until can be seen Refill sent as requested ./cy

## 2019-03-03 NOTE — Telephone Encounter (Signed)
Called pt and LVM to call and schedule appt with Dr. Gwenlyn Found.  Last seen 2018.

## 2019-03-03 NOTE — Telephone Encounter (Signed)
Pt called and wanted to speak with [name]. She said she was having an issue and wanted to talk with someone who is familiar with her situation.  Pt c/o medication issue:  1. Name of Medication:  atorvastatin (LIPITOR) 40 MG tablet atenolol (TENORMIN) 25 MG tablet  2. How are you currently taking this medication (dosage and times per day)?  As directed  3. Are you having a reaction (difficulty breathing--STAT)? no  4. What is your medication issue?  Pt is having issues with her refill request.

## 2019-03-25 DIAGNOSIS — K589 Irritable bowel syndrome without diarrhea: Secondary | ICD-10-CM | POA: Insufficient documentation

## 2019-03-25 DIAGNOSIS — IMO0002 Reserved for concepts with insufficient information to code with codable children: Secondary | ICD-10-CM | POA: Insufficient documentation

## 2019-03-25 DIAGNOSIS — B009 Herpesviral infection, unspecified: Secondary | ICD-10-CM | POA: Insufficient documentation

## 2019-04-26 DIAGNOSIS — F3341 Major depressive disorder, recurrent, in partial remission: Secondary | ICD-10-CM | POA: Insufficient documentation

## 2019-07-14 ENCOUNTER — Encounter: Payer: Self-pay | Admitting: Orthopedic Surgery

## 2019-07-14 ENCOUNTER — Ambulatory Visit (INDEPENDENT_AMBULATORY_CARE_PROVIDER_SITE_OTHER): Payer: BLUE CROSS/BLUE SHIELD | Admitting: Orthopedic Surgery

## 2019-07-14 DIAGNOSIS — M7711 Lateral epicondylitis, right elbow: Secondary | ICD-10-CM | POA: Diagnosis not present

## 2019-07-16 ENCOUNTER — Encounter: Payer: Self-pay | Admitting: Orthopedic Surgery

## 2019-07-16 NOTE — Progress Notes (Signed)
Office Visit Note   Patient: [name]           Date of Birth: 1971/03/05           MRN: [id number] Visit Date: 07/14/2019 Requested by: Leighton Ruff, MD [place],  [place] [address] PCP: Leighton Ruff, MD  Subjective: Chief Complaint  Patient presents with  . Right Elbow - Pain    HPI: [name] is a 48 y.o. female who presents to the office complaining of right elbow pain.  Patient was previously treated in October 2019 for tennis elbow of the right elbow.  She had a cortisone injection which provided relief for 6 months.  She notes that her right elbow pain is slowly returned over the past couple months and she now notes constant pain.  Her pain is worse at night.  She uses ibuprofen as needed.  She has tried occasional stretching inconsistently.  She has also tried a tennis elbow band which made her symptoms worse.  She is taking pen said in the past without relief.  Denies any numbness or tingling.  Pain is worse with tight grip.              ROS:  All systems reviewed are negative as they relate to the chief complaint within the history of present illness.  Patient denies fevers or chills.  Assessment & Plan: Visit Diagnoses: No diagnosis found.  Plan: Patient is a 48 year old female who presents for reevaluation of right tennis elbow.  She had good symptomatic relief from previous cortisone injection.  Discussed options today in the clinic.  Discussed the long-term prognosis of tennis elbow when treated with cortisone injections versus PRP injection versus anti-inflammatories and stretching routine.  Recommended leukocyte rich PRP injection.  Patient stated that she would like to try another cortisone injection to see how much relief that gives her.  Ultrasound guided cortisone injection successfully administered to the insertion of the ECRB tendon.  Patient tolerated the procedure well and will follow-up PRN.  Follow-Up Instructions: No  follow-ups on file.   Orders:  No orders of the defined types were placed in this encounter.  No orders of the defined types were placed in this encounter.     Procedures: Hand/UE Inj for lateral epicondylitis on 07/18/2019 7:11 PM Indications: therapeutic Details: 22 G needle, ultrasound-guided lateral approach Medications: 3 mL lidocaine 1 %; 0.66 mL bupivacaine 0.5 %; 13.33 mg methylPREDNISolone acetate 40 MG/ML Outcome: tolerated well, no immediate complications Procedure, treatment alternatives, risks and benefits explained, specific risks discussed. Immediately prior to procedure a time out was called to verify the correct patient, procedure, equipment, support staff and site/side marked as required. Patient was prepped and draped in the usual sterile fashion.       Clinical Data: No additional findings.  Objective: Vital Signs: There were no vitals taken for this visit.  Physical Exam:  Constitutional: Patient appears well-developed HEENT:  Head: Normocephalic Eyes:EOM are normal Neck: Normal range of motion Cardiovascular: Normal rate Pulmonary/chest: Effort normal Neurologic: Patient is alert Skin: Skin is warm Psychiatric: Patient has normal mood and affect  Ortho Exam:  TTP over the lateral epicondyle No tenderness over the medial epicondyle No tenderness over the distal bicep tendon, no Popeye deformity Pain elicited at the lateral epicondyle with resisted wrist extension and with strong grip strength No tenderness over the posterior interosseous nerve Full range of motion of the right elbow No significant swelling or discoloration noted  Specialty Comments:  No specialty comments available.  Imaging: No results found.   PMFS History: Patient Active Problem List   Diagnosis Date Noted  . PMDD (premenstrual dysphoric disorder) 04/22/2017  . Abnormal Pap smear of cervix 04/22/2017  . Pain in left ankle and joints of left foot 11/29/2016  .  Radiculopathy due to lumbar intervertebral disc disorder 10/02/2016  . Family history of coronary artery disease 09/07/2014  . DOE (dyspnea on exertion) 09/07/2014  . Hypothyroid 09/07/2014  . Back pain 09/07/2014  . Excessive sweating 08/04/2014  . Essential hypertension 10/18/2013  . 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 Chest pain with moderate risk of acute coronary syndrome 07/27/2012  . Hyperlipidemia 07/27/2012  . Menopausal symptom 02/06/2012  . Fatigue 02/06/2012  . Morbid obesity ([place]) 02/06/2012  . Bariatric surgery status 02/06/2012   Past Medical History:  Diagnosis Date  . Abnormal cytological finding in specimen from cervix   . Chronic constipation   . Complication of anesthesia    post-op  urinary retention (per pt happens after every surgery)  . Family history of coronary artery disease   . History of gastric ulcer   . Hyperlipidemia   . Hypertension   . Hypothyroidism   . 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  at  [place]  . Wears contact lenses     Family History  Problem Relation Age of Onset  . Diabetes Paternal Grandfather   . Breast cancer Paternal Grandmother   . Breast cancer Maternal Grandmother   . Diabetes Father   . Coronary artery disease Father   . Coronary artery disease Mother     Past Surgical History:  Procedure Laterality Date  . ANAL FISSURE REPAIR  07/10/2000  . BUNIONECTOMY Right 2004  . CARDIOVASCULAR STRESS TEST  09-21-2014  dr berry   nuclear study w/ a reversible anterior wall perfusion abnormality in the distribution of the LAD artery, consistant with ischemia (this was a 2-day study in a marked breast attenutation artifact, high likeihood of "shifting breast artifact")/  normal LV function and wall motion , ef 68%  . CARPAL TUNNEL RELEASE Bilateral early 2000s  . CERVICAL CONIZATION W/BX N/A 04/22/2017   Procedure: CONIZATION CERVIX WITH BIOPSY;  Surgeon: Eldred Manges, MD;   Location: [place];  Service: Gynecology;  Laterality: N/A;  . COMBINED REDUCTION MAMMAPLASTY W/ ABDOMINOPLASTY  04/2009  . LAPAROSCOPIC CHOLECYSTECTOMY  08/12/2003  . LEFT HEART CATHETERIZATION WITH CORONARY ANGIOGRAM N/A 10/03/2014   Procedure: LEFT HEART CATHETERIZATION WITH CORONARY ANGIOGRAM;  Surgeon: Lorretta Harp, MD;  Location: [facility] CATH LAB;  Service: Cardiovascular;  Laterality: N/A;  essentially normal coronary arteries and normal LVF (false postitive stress test and her symptoms noncardiac)  . NASAL SEPTOPLASTY W/ TURBINOPLASTY  07/14/2006  . REPAIR PERIUMBILICAL HERNIA W/ MESH  03/21/2007  . ROUX-EN-Y GASTRIC BYPASS  07-18-2015    [facility]   via Laparoscopy  . TRANSTHORACIC ECHOCARDIOGRAM  09-21-2014   dr berry   grade 2 diastolic dysfunction, ef [id number]  trivial MR and TR   Social History   Occupational History  . Not on file  Tobacco Use  . Smoking status: Never Smoker  . Smokeless tobacco: Never Used  Substance and Sexual Activity  . Alcohol use: No  . Drug use: No  . Sexual activity: Yes    Birth control/protection: Inserts    Comment: nuvaring

## 2019-07-18 DIAGNOSIS — M7711 Lateral epicondylitis, right elbow: Secondary | ICD-10-CM | POA: Diagnosis not present

## 2019-07-18 MED ORDER — METHYLPREDNISOLONE ACETATE 40 MG/ML IJ SUSP
13.3300 mg | INTRAMUSCULAR | Status: AC | PRN
  Administered 2019-07-18: 13.33 mg

## 2019-07-18 MED ORDER — BUPIVACAINE HCL 0.5 % IJ SOLN
0.6600 mL | INTRAMUSCULAR | Status: AC | PRN
  Administered 2019-07-18: .66 mL

## 2019-07-18 MED ORDER — LIDOCAINE HCL 1 % IJ SOLN
3.0000 mL | INTRAMUSCULAR | Status: AC | PRN
  Administered 2019-07-18: 3 mL

## 2020-01-24 ENCOUNTER — Other Ambulatory Visit: Payer: Self-pay

## 2020-01-24 ENCOUNTER — Encounter: Payer: Self-pay | Admitting: Family Medicine

## 2020-01-24 ENCOUNTER — Ambulatory Visit: Payer: Self-pay

## 2020-01-24 ENCOUNTER — Ambulatory Visit: Payer: PRIVATE HEALTH INSURANCE | Admitting: Family Medicine

## 2020-01-24 DIAGNOSIS — N3281 Overactive bladder: Secondary | ICD-10-CM | POA: Insufficient documentation

## 2020-01-24 DIAGNOSIS — M79644 Pain in right finger(s): Secondary | ICD-10-CM

## 2020-01-24 NOTE — Progress Notes (Signed)
Office Visit Note   Patient: [name]           Date of Birth: May 26, 1971           MRN: [id number] Visit Date: 01/24/2020 Requested by: Leighton Ruff, MD [place],  [place] [address] PCP: Leighton Ruff, MD  Subjective: Chief Complaint  Patient presents with  . Right Middle Finger - Pain, Injury    2 weeks ago, while walking her dogs, the leash wrapped around the distal portion of her finger when one of the dogs jerked on the leash (saw another dog).    HPI: She is here with right third finger pain.  She is right-hand dominant.  About 2 weeks ago while walking her dogs, one of them jerked forward and the leash wrapped around her finger.  She had immediate pain at the DIP joints.  It swelled over the next couple days and has been very tender since then, still very swollen and stiff.  No previous problems with her finger.  She is otherwise in good health.               ROS:   All other systems were reviewed and are negative.  Objective: Vital Signs: There were no vitals taken for this visit.  Physical Exam:  General:  Alert and oriented, in no acute distress. Pulm:  Breathing unlabored. Psy:  Normal mood, congruent affect. Skin: There is some erythema on the dorsal aspect of the DIP joint of her third finger.   Right hand: Third finger DIP joint is tender dorsally.  There is no significant laxity of the collateral ligaments.  She is able to actively fully extend her finger, flexion is limited to about 30 degrees.  She has intact tendon function.   Imaging: X-ray right third finger: She has an intra-articular but nondisplaced distal phalanx fracture.    Assessment & Plan: 1.  2 weeks status post right third finger distal phalanx fracture, nondisplaced. -Splint for protection, but she will remove it daily to work on range of motion gently.  Anticipate 6 to 8 weeks total healing time.  If in 2-3 more weeks she is still not making progress, she will  come back in for repeat x-rays.     Procedures: No procedures performed  No notes on file     PMFS History: Patient Active Problem List   Diagnosis Date Noted  . Overactive bladder 01/24/2020  . Recurrent major depressive disorder, in partial remission ([place]) 04/26/2019  . Herpes simplex type 1 infection 03/25/2019  . Irritable bowel syndrome 03/25/2019  . 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 Weight gain following gastric bypass surgery 06/19/2018  . Generalized abdominal pain 07/21/2017  . PMDD (premenstrual dysphoric disorder) 04/22/2017  . Abnormal Pap smear of cervix 04/22/2017  . Pain in left ankle and joints of left foot 11/29/2016  . Radiculopathy due to lumbar intervertebral disc disorder 10/02/2016  . Family history of coronary artery disease 09/07/2014  . DOE (dyspnea on exertion) 09/07/2014  . Hypothyroid 09/07/2014  . Back pain 09/07/2014  . Excessive sweating 08/04/2014  . Essential hypertension 10/18/2013  . 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 Chest pain with moderate risk of acute coronary syndrome 07/27/2012  . Hyperlipidemia 07/27/2012  . Menopausal symptom 02/06/2012  . Fatigue 02/06/2012  . Morbid obesity ([place]) 02/06/2012  . Bariatric surgery status 02/06/2012   Past Medical History:  Diagnosis Date  . Abnormal  cytological finding in specimen from cervix   . Chronic constipation   . Complication of anesthesia    post-op  urinary retention (per pt happens after every surgery)  . Family history of coronary artery disease   . History of gastric ulcer   . Hyperlipidemia   . Hypertension   . Hypothyroidism   . 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  at  [place]  . Wears contact lenses     Family History  Problem Relation Age of Onset  . Diabetes Paternal Grandfather   . Breast cancer Paternal Grandmother   . Breast cancer Maternal Grandmother   . Diabetes Father   . Coronary  artery disease Father   . Coronary artery disease Mother     Past Surgical History:  Procedure Laterality Date  . ANAL FISSURE REPAIR  07/10/2000  . BUNIONECTOMY Right 2004  . CARDIOVASCULAR STRESS TEST  09-21-2014  dr berry   nuclear study w/ a reversible anterior wall perfusion abnormality in the distribution of the LAD artery, consistant with ischemia (this was a 2-day study in a marked breast attenutation artifact, high likeihood of "shifting breast artifact")/  normal LV function and wall motion , ef 68%  . CARPAL TUNNEL RELEASE Bilateral early 2000s  . CERVICAL CONIZATION W/BX N/A 04/22/2017   Procedure: CONIZATION CERVIX WITH BIOPSY;  Surgeon: Eldred Manges, MD;  Location: [place];  Service: Gynecology;  Laterality: N/A;  . COMBINED REDUCTION MAMMAPLASTY W/ ABDOMINOPLASTY  04/2009  . LAPAROSCOPIC CHOLECYSTECTOMY  08/12/2003  . LEFT HEART CATHETERIZATION WITH CORONARY ANGIOGRAM N/A 10/03/2014   Procedure: LEFT HEART CATHETERIZATION WITH CORONARY ANGIOGRAM;  Surgeon: Lorretta Harp, MD;  Location: [facility] CATH LAB;  Service: Cardiovascular;  Laterality: N/A;  essentially normal coronary arteries and normal LVF (false postitive stress test and her symptoms noncardiac)  . NASAL SEPTOPLASTY W/ TURBINOPLASTY  07/14/2006  . REPAIR PERIUMBILICAL HERNIA W/ MESH  03/21/2007  . ROUX-EN-Y GASTRIC BYPASS  07-18-2015    [facility]   via Laparoscopy  . TRANSTHORACIC ECHOCARDIOGRAM  09-21-2014   dr berry   grade 2 diastolic dysfunction, ef [id number]  trivial MR and TR   Social History   Occupational History  . Not on file  Tobacco Use  . Smoking status: Never Smoker  . Smokeless tobacco: Never Used  Substance and Sexual Activity  . Alcohol use: No  . Drug use: No  . Sexual activity: Yes    Birth control/protection: Inserts    Comment: nuvaring

## 2020-03-14 ENCOUNTER — Other Ambulatory Visit: Payer: Self-pay

## 2020-03-17 ENCOUNTER — Other Ambulatory Visit: Payer: Self-pay

## 2020-03-30 ENCOUNTER — Other Ambulatory Visit: Payer: Self-pay | Admitting: Cardiovascular Disease

## 2020-05-08 ENCOUNTER — Telehealth: Payer: Self-pay | Admitting: Family Medicine

## 2020-05-08 NOTE — Telephone Encounter (Signed)
CD made 

## 2020-05-08 NOTE — Telephone Encounter (Signed)
Please copy any imaging available to CD. I am gathering records also for pt. Let me know when ready and I will call patient. Thanks

## 2020-05-15 ENCOUNTER — Other Ambulatory Visit: Payer: Self-pay | Admitting: Cardiovascular Disease

## 2020-05-16 ENCOUNTER — Ambulatory Visit: Payer: PRIVATE HEALTH INSURANCE | Admitting: Family Medicine

## 2020-05-17 ENCOUNTER — Ambulatory Visit: Payer: PRIVATE HEALTH INSURANCE | Admitting: Family Medicine

## 2020-06-23 ENCOUNTER — Telehealth: Payer: Self-pay | Admitting: Physical Medicine and Rehabilitation

## 2020-06-23 NOTE — Telephone Encounter (Signed)
Patient called.   She will not have insurance coverage and wanted to price some injections out of pocket.   Call back: [phone]

## 2021-01-23 ENCOUNTER — Other Ambulatory Visit: Payer: Self-pay | Admitting: Obstetrics and Gynecology

## 2021-01-23 DIAGNOSIS — R102 Pelvic and perineal pain: Secondary | ICD-10-CM

## 2021-01-24 ENCOUNTER — Ambulatory Visit
Admission: RE | Admit: 2021-01-24 | Discharge: 2021-01-24 | Disposition: A | Discharge status: Home / Self Care | Payer: PRIVATE HEALTH INSURANCE | Source: Ambulatory Visit | Attending: Obstetrics and Gynecology | Admitting: Obstetrics and Gynecology

## 2021-01-24 ENCOUNTER — Other Ambulatory Visit: Payer: Self-pay | Admitting: Obstetrics and Gynecology

## 2021-01-24 DIAGNOSIS — R102 Pelvic and perineal pain: Secondary | ICD-10-CM

## 2021-03-28 ENCOUNTER — Other Ambulatory Visit: Payer: Self-pay | Admitting: Neurological Surgery

## 2021-03-28 DIAGNOSIS — M5136 Other intervertebral disc degeneration, lumbar region: Secondary | ICD-10-CM

## 2021-04-17 ENCOUNTER — Other Ambulatory Visit: Payer: PRIVATE HEALTH INSURANCE

## 2021-12-28 ENCOUNTER — Other Ambulatory Visit: Payer: Self-pay

## 2021-12-28 ENCOUNTER — Ambulatory Visit (AMBULATORY_SURGERY_CENTER): Payer: PRIVATE HEALTH INSURANCE

## 2021-12-28 VITALS — Ht 62.5 in | Wt 215.0 lb

## 2021-12-28 DIAGNOSIS — Z1211 Encounter for screening for malignant neoplasm of colon: Secondary | ICD-10-CM

## 2021-12-28 MED ORDER — NA SULFATE-K SULFATE-MG SULF 17.5-3.13-1.6 GM/177ML PO SOLN: 1.0000 | Freq: Once | ORAL | 0 refills | Status: AC

## 2021-12-28 NOTE — Progress Notes (Signed)
Denies allergies to eggs or soy products. Denies complication of anesthesia or sedation. Denies use of weight loss medication. Denies use of O2.   Emmi instructions given for colonoscopy.  

## 2022-01-06 ENCOUNTER — Encounter: Payer: Self-pay | Admitting: Gastroenterology

## 2022-01-07 ENCOUNTER — Telehealth: Payer: Self-pay

## 2022-01-07 NOTE — Telephone Encounter (Signed)
[name] notified of add on procedure. ?

## 2022-01-07 NOTE — Telephone Encounter (Signed)
To add an EGD we need to move her colonoscopy to 2:30 pm on 3/27 and add an EGD. Please then block the 4:00 pm slot on 3/27. Please try to obtain records from her prior colonoscopy including pathology.  ?MS  ?

## 2022-01-07 NOTE — Telephone Encounter (Signed)
Patient has been rescheduled for endo colon on 3/27 at 2:30 pm. 4pm slot has been blocked per Dr. Fuller Plan. Patient has been sent mychart message.  ?

## 2022-01-10 ENCOUNTER — Encounter: Payer: Self-pay | Admitting: Gastroenterology

## 2022-01-11 ENCOUNTER — Telehealth: Payer: Self-pay

## 2022-01-11 NOTE — Telephone Encounter (Signed)
Spoke with Janett Billow from [place] regarding patient records that have not been received yet. I have faxed over paper copy & electronically request sheet again.  ?

## 2022-01-11 NOTE — Telephone Encounter (Signed)
Received records. Placed in Dr. Lynne Leader office.  ?

## 2022-01-14 ENCOUNTER — Encounter: Payer: Self-pay | Admitting: Gastroenterology

## 2022-01-14 ENCOUNTER — Encounter: Payer: PRIVATE HEALTH INSURANCE | Admitting: Gastroenterology

## 2022-01-14 ENCOUNTER — Ambulatory Visit (AMBULATORY_SURGERY_CENTER): Payer: PRIVATE HEALTH INSURANCE | Admitting: Gastroenterology

## 2022-01-14 ENCOUNTER — Other Ambulatory Visit: Payer: Self-pay

## 2022-01-14 VITALS — BP 115/76 | HR 62 | Temp 97.8°F | Resp 11 | Ht 62.5 in | Wt 215.0 lb

## 2022-01-14 DIAGNOSIS — K289 Gastrojejunal ulcer, unspecified as acute or chronic, without hemorrhage or perforation: Secondary | ICD-10-CM

## 2022-01-14 DIAGNOSIS — Z1211 Encounter for screening for malignant neoplasm of colon: Secondary | ICD-10-CM | POA: Diagnosis present

## 2022-01-14 DIAGNOSIS — K9189 Other postprocedural complications and disorders of digestive system: Secondary | ICD-10-CM

## 2022-01-14 DIAGNOSIS — K2960 Other gastritis without bleeding: Secondary | ICD-10-CM

## 2022-01-14 DIAGNOSIS — K219 Gastro-esophageal reflux disease without esophagitis: Secondary | ICD-10-CM | POA: Diagnosis not present

## 2022-01-14 DIAGNOSIS — D123 Benign neoplasm of transverse colon: Secondary | ICD-10-CM

## 2022-01-14 DIAGNOSIS — K635 Polyp of colon: Secondary | ICD-10-CM | POA: Diagnosis not present

## 2022-01-14 MED ORDER — ESOMEPRAZOLE MAGNESIUM 40 MG PO CPDR: 40.0000 mg | DELAYED_RELEASE_CAPSULE | Freq: Every morning | ORAL | 11 refills | Status: DC

## 2022-01-14 MED ORDER — SODIUM CHLORIDE 0.9 % IV SOLN: 500.0000 mL | Freq: Once | INTRAVENOUS | Status: DC

## 2022-01-14 NOTE — Op Note (Signed)
[place] ?Patient Name: [name] ?Procedure Date: 01/14/2022 2:27 PM ?MRN: [id number] ?Endoscopist: Ladene Artist , MD ?Age: 51 ?Referring MD:  ?Date of Birth: [birth year] ?Gender: Female ?Account #: [id number] ?Procedure:                Upper GI endoscopy ?Indications:              Gastro-esophageal reflux disease ?Medicines:                Monitored Anesthesia Care ?Procedure:                Pre-Anesthesia Assessment: ?                          - Prior to the procedure, a History and Physical  ?                          was performed, and patient medications and  ?                          allergies were reviewed. The patient's tolerance of  ?                          previous anesthesia was also reviewed. The risks  ?                          and benefits of the procedure and the sedation  ?                          options and risks were discussed with the patient.  ?                          All questions were answered, and informed consent  ?                          was obtained. Prior Anticoagulants: The patient has  ?                          taken no previous anticoagulant or antiplatelet  ?                          agents. ASA Grade Assessment: II - A patient with  ?                          mild systemic disease. After reviewing the risks  ?                          and benefits, the patient was deemed in  ?                          satisfactory condition to undergo the procedure. ?                          After obtaining informed consent, the endoscope was  ?  passed under direct vision. Throughout the  ?                          procedure, the patient's blood pressure, pulse, and  ?                          oxygen saturations were monitored continuously. The  ?                          GIF HQ190 #[id number] was introduced through the  ?                          mouth, and advanced to the efferent jejunal loop.  ?                          The upper GI endoscopy  was accomplished without  ?                          difficulty. The patient tolerated the procedure  ?                          well. ?Scope In: ?Scope Out: ?Findings:                 The examined esophagus was normal. ?                          Evidence of a gastric bypass was found. A gastric  ?                          pouch with a normal size was found. The  ?                          gastrojejunal anastomosis was characterized by  ?                          ulceration, edema and erythema. This was traversed.  ?                          Biopsies were taken with a cold forceps around the  ?                          ulcer and in the body of the stomach. The efferent  ?                          limb otherwise appeared normal. Afferent limb was  ?                          not found. ?Complications:            No immediate complications. ?Estimated Blood Loss:     Estimated blood loss was minimal. ?Impression:               - Normal esophagus. ?                          -  Gastric bypass with a normal-sized pouch. ?                          - Gastrojejunal anastomosis characterized by  ?                          ulceration. Biopsied. ?Recommendation:           - Patient has a contact number available for  ?                          emergencies. The signs and symptoms of potential  ?                          delayed complications were discussed with the  ?                          patient. Return to normal activities tomorrow.  ?                          Written discharge instructions were provided to the  ?                          patient. ?                          - Resume previous diet. ?                          - Continue present medications. ?                          - Nexium 40 mg capsules, open capsule sprinkle  ?                          granules in yogurt, apple sauce PO qam, 1 year of  ?                          refills ?                          - No aspirin, ibuprofen, naproxen, or other  ?                           non-steroidal anti-inflammatory drugs long term. ?                          - Await pathology results. ?Ladene Artist, MD ?01/14/2022 3:10:12 PM ?This report has been signed electronically. ?

## 2022-01-14 NOTE — Progress Notes (Signed)
Report to PACU, RN, vss, BBS= Clear.  

## 2022-01-14 NOTE — Progress Notes (Signed)
See 01/04/2022 H&P, no changes.  ?

## 2022-01-14 NOTE — Op Note (Signed)
[place] ?Patient Name: [name] ?Procedure Date: 01/14/2022 2:28 PM ?MRN: [id number] ?Endoscopist: Ladene Artist , MD ?Age: 51 ?Referring MD:  ?Date of Birth: 08-Feb-1971 ?Gender: Female ?Account #: [id number] ?Procedure:                Colonoscopy ?Indications:              Screening for colorectal malignant neoplasm ?Medicines:                Monitored Anesthesia Care ?Procedure:                Pre-Anesthesia Assessment: ?                          - Prior to the procedure, a History and Physical  ?                          was performed, and patient medications and  ?                          allergies were reviewed. The patient's tolerance of  ?                          previous anesthesia was also reviewed. The risks  ?                          and benefits of the procedure and the sedation  ?                          options and risks were discussed with the patient.  ?                          All questions were answered, and informed consent  ?                          was obtained. Prior Anticoagulants: The patient has  ?                          taken no previous anticoagulant or antiplatelet  ?                          agents. ASA Grade Assessment: II - A patient with  ?                          mild systemic disease. After reviewing the risks  ?                          and benefits, the patient was deemed in  ?                          satisfactory condition to undergo the procedure. ?                          After obtaining informed consent, the colonoscope  ?  was passed under direct vision. Throughout the  ?                          procedure, the patient's blood pressure, pulse, and  ?                          oxygen saturations were monitored continuously. The  ?                          [id number]-HQ190L Colonoscope was introduced through the  ?                          anus and advanced to the the cecum, identified by  ?                          appendiceal  orifice and ileocecal valve. The  ?                          ileocecal valve, appendiceal orifice, and rectum  ?                          were photographed. The quality of the bowel  ?                          preparation was adequate after extensive lavage and  ?                          suction. The colonoscopy was performed without  ?                          difficulty. The patient tolerated the procedure  ?                          well. ?Scope In: 2:34:56 PM ?Scope Out: 2:53:05 PM ?Scope Withdrawal Time: 0 hours 15 minutes 18 seconds  ?Total Procedure Duration: 0 hours 18 minutes 9 seconds  ?Findings:                 The perianal and digital rectal examinations were  ?                          normal. ?                          A 8 mm polyp was found in the proximal transverse  ?                          colon. The polyp was sessile. The polyp was removed  ?                          with a cold snare. Resection and retrieval were  ?                          complete. ?  The exam was otherwise without abnormality on  ?                          direct and retroflexion views. ?Complications:            No immediate complications. Estimated blood loss:  ?                          None. ?Estimated Blood Loss:     Estimated blood loss: none. ?Impression:               - One 8 mm polyp in the proximal transverse colon,  ?                          removed with a cold snare. Resected and retrieved. ?                          - The examination was otherwise normal on direct  ?                          and retroflexion views. ?Recommendation:           - Repeat colonoscopy after studies are complete for  ?                          surveillance based on pathology results with a more  ?                          extensive bowel prep. ?                          - Patient has a contact number available for  ?                          emergencies. The signs and symptoms of potential  ?                           delayed complications were discussed with the  ?                          patient. Return to normal activities tomorrow.  ?                          Written discharge instructions were provided to the  ?                          patient. ?                          - Resume previous diet. ?                          - Continue present medications. ?                          - Await pathology results. ?Ladene Artist, MD ?01/14/2022 3:03:21 PM ?This report has  been signed electronically. ?

## 2022-01-14 NOTE — Progress Notes (Signed)
Pt's states no medical or surgical changes since previsit or office visit. 

## 2022-01-14 NOTE — Progress Notes (Signed)
Called to room to assist during endoscopic procedure.  Patient ID and intended procedure confirmed with present staff. Received instructions for my participation in the procedure from the performing physician.  

## 2022-01-14 NOTE — Patient Instructions (Signed)
YOU HAD AN ENDOSCOPIC PROCEDURE TODAY AT [facility]:   Refer to the procedure report that was given to you for any specific questions about what was found during the examination.  If the procedure report does not answer your questions, please call your gastroenterologist to clarify.  If you requested that your care partner not be given the details of your procedure findings, then the procedure report has been included in a sealed envelope for you to review at your convenience later. ? ?**Handout given on polyps** ? ?YOU SHOULD EXPECT: Some feelings of bloating in the abdomen. Passage of more gas than usual.  Walking can help get rid of the air that was put into your GI tract during the procedure and reduce the bloating. If you had a lower endoscopy (such as a colonoscopy or flexible sigmoidoscopy) you may notice spotting of blood in your stool or on the toilet paper. If you underwent a bowel prep for your procedure, you may not have a normal bowel movement for a few days. ? ?Please Note:  You might notice some irritation and congestion in your nose or some drainage.  This is from the oxygen used during your procedure.  There is no need for concern and it should clear up in a day or so. ? ?SYMPTOMS TO REPORT IMMEDIATELY: ? ?Following lower endoscopy (colonoscopy or flexible sigmoidoscopy): ? Excessive amounts of blood in the stool ? Significant tenderness or worsening of abdominal pains ? Swelling of the abdomen that is new, acute ? Fever of 100?F or higher ? ?Following upper endoscopy (EGD) ? Vomiting of blood or coffee ground material ? New chest pain or pain under the shoulder blades ? Painful or persistently difficult swallowing ? New shortness of breath ? Fever of 100?F or higher ? Black, tarry-looking stools ? ?For urgent or emergent issues, a gastroenterologist can be reached at any hour by calling [phone]. ?Do not use MyChart messaging for urgent concerns.  ? ? ?DIET:  We do recommend  a small meal at first, but then you may proceed to your regular diet.  Drink plenty of fluids but you should avoid alcoholic beverages for 24 hours. ? ?ACTIVITY:  You should plan to take it easy for the rest of today and you should NOT DRIVE or use heavy machinery until tomorrow (because of the sedation medicines used during the test).   ? ?FOLLOW UP: ?Our staff will call the number listed on your records 48-72 hours following your procedure to check on you and address any questions or concerns that you may have regarding the information given to you following your procedure. If we do not reach you, we will leave a message.  We will attempt to reach you two times.  During this call, we will ask if you have developed any symptoms of COVID 19. If you develop any symptoms (ie: fever, flu-like symptoms, shortness of breath, cough etc.) before then, please call [phone].  If you test positive for Covid 19 in the 2 weeks post procedure, please call and report this information to [place].   ? ?If any biopsies were taken you will be contacted by phone or by letter within the next 1-3 weeks.  Please call us at [phone] if you have not heard about the biopsies in 3 weeks.  ? ? ?SIGNATURES/CONFIDENTIALITY: ?You and/or your care partner have signed paperwork which will be entered into your electronic medical record.  These signatures attest to the fact that that the  information above on your After Visit Summary has been reviewed and is understood.  Full responsibility of the confidentiality of this discharge information lies with you and/or your care-partner.  ?

## 2022-01-16 ENCOUNTER — Telehealth: Payer: Self-pay | Admitting: *Deleted

## 2022-01-16 NOTE — Telephone Encounter (Signed)
?  Follow up Call- ? ? ?  01/14/2022  ?  1:57 PM  ?Call back number  ?Post procedure Call Back phone  # [phone]  ?Permission to leave phone message Yes  ?  ? ?Patient questions: ? ?Do you have a fever, pain , or abdominal swelling? No. ?Pain Score  0 * ? ?Have you tolerated food without any problems? Yes.   ? ?Have you been able to return to your normal activities? Yes.   ? ?Do you have any questions about your discharge instructions: ?Diet   No. ?Medications  No. ?Follow up visit  No. ? ?Do you have questions or concerns about your Care? No. ? ?Actions: ?* If pain score is 4 or above: ?No action needed, pain <4. ? ? ?

## 2022-02-04 ENCOUNTER — Encounter: Payer: Self-pay | Admitting: Gastroenterology

## 2023-02-06 ENCOUNTER — Other Ambulatory Visit: Payer: Self-pay | Admitting: Gastroenterology

## 2023-02-06 DIAGNOSIS — K219 Gastro-esophageal reflux disease without esophagitis: Secondary | ICD-10-CM

## 2023-03-19 ENCOUNTER — Encounter: Payer: Self-pay | Admitting: Gastroenterology

## 2023-03-24 ENCOUNTER — Encounter: Payer: Self-pay | Admitting: Physician Assistant

## 2023-03-24 ENCOUNTER — Other Ambulatory Visit: Payer: Self-pay

## 2023-03-24 ENCOUNTER — Telehealth: Payer: Self-pay

## 2023-03-24 ENCOUNTER — Other Ambulatory Visit (INDEPENDENT_AMBULATORY_CARE_PROVIDER_SITE_OTHER): Payer: PRIVATE HEALTH INSURANCE

## 2023-03-24 ENCOUNTER — Ambulatory Visit (HOSPITAL_COMMUNITY)
Admission: RE | Admit: 2023-03-24 | Discharge: 2023-03-24 | Disposition: A | Discharge status: Home / Self Care | Payer: 59 | Source: Ambulatory Visit | Attending: Physician Assistant | Admitting: Physician Assistant

## 2023-03-24 ENCOUNTER — Ambulatory Visit: Payer: PRIVATE HEALTH INSURANCE | Admitting: Physician Assistant

## 2023-03-24 VITALS — BP 146/68 | HR 62 | Ht 63.0 in | Wt 220.0 lb

## 2023-03-24 DIAGNOSIS — K6389 Other specified diseases of intestine: Secondary | ICD-10-CM | POA: Diagnosis present

## 2023-03-24 DIAGNOSIS — Z9884 Bariatric surgery status: Secondary | ICD-10-CM | POA: Insufficient documentation

## 2023-03-24 DIAGNOSIS — R1013 Epigastric pain: Secondary | ICD-10-CM

## 2023-03-24 DIAGNOSIS — K289 Gastrojejunal ulcer, unspecified as acute or chronic, without hemorrhage or perforation: Secondary | ICD-10-CM | POA: Diagnosis not present

## 2023-03-24 LAB — CBC WITH DIFFERENTIAL/PLATELET
Basophils Absolute: 0.1 10*3/uL (ref 0.0–0.1)
Basophils Relative: 0.6 % (ref 0.0–3.0)
Eosinophils Absolute: 0.4 10*3/uL (ref 0.0–0.7)
Eosinophils Relative: 4.4 % (ref 0.0–5.0)
HCT: 37.8 % (ref 36.0–46.0)
Hemoglobin: 12.3 g/dL (ref 12.0–15.0)
Lymphocytes Relative: 21.2 % (ref 12.0–46.0)
Lymphs Abs: 2 10*3/uL (ref 0.7–4.0)
MCHC: 32.5 g/dL (ref 30.0–36.0)
MCV: 84.7 fl (ref 78.0–100.0)
Monocytes Absolute: 0.5 10*3/uL (ref 0.1–1.0)
Monocytes Relative: 5.1 % (ref 3.0–12.0)
Neutro Abs: 6.4 10*3/uL (ref 1.4–7.7)
Neutrophils Relative %: 68.7 % (ref 43.0–77.0)
Platelets: 295 10*3/uL (ref 150.0–400.0)
RBC: 4.47 Mil/uL (ref 3.87–5.11)
RDW: 14.2 % (ref 11.5–15.5)
WBC: 9.4 10*3/uL (ref 4.0–10.5)

## 2023-03-24 LAB — COMPREHENSIVE METABOLIC PANEL
ALT: 22 U/L (ref 0–35)
AST: 20 U/L (ref 0–37)
Albumin: 4.1 g/dL (ref 3.5–5.2)
Alkaline Phosphatase: 87 U/L (ref 39–117)
BUN: 9 mg/dL (ref 6–23)
CO2: 24 mEq/L (ref 19–32)
Calcium: 9.2 mg/dL (ref 8.4–10.5)
Chloride: 104 mEq/L (ref 96–112)
Creatinine, Ser: 0.83 mg/dL (ref 0.40–1.20)
GFR: 79.99 mL/min (ref >60.00)
Glucose, Bld: 98 mg/dL (ref 70–99)
Potassium: 4.2 mEq/L (ref 3.5–5.1)
Sodium: 138 mEq/L (ref 135–145)
Total Bilirubin: 0.8 mg/dL (ref 0.2–1.2)
Total Protein: 6.9 g/dL (ref 6.0–8.3)

## 2023-03-24 LAB — LIPASE: Lipase: 34 U/L (ref 11.0–59.0)

## 2023-03-24 MED ORDER — IOHEXOL 9 MG/ML PO SOLN
ORAL | Status: AC
  Filled 2023-03-24: qty 1000

## 2023-03-24 MED ORDER — IOHEXOL 9 MG/ML PO SOLN
1000.0000 mL | ORAL | Status: AC
  Administered 2023-03-24: 1000 mL via ORAL

## 2023-03-24 MED ORDER — ESOMEPRAZOLE MAGNESIUM 40 MG PO CPDR: 40.0000 mg | DELAYED_RELEASE_CAPSULE | Freq: Every day | ORAL | 3 refills | Status: DC

## 2023-03-24 MED ORDER — IOHEXOL 300 MG/ML  SOLN
100.0000 mL | Freq: Once | INTRAMUSCULAR | Status: AC | PRN
  Administered 2023-03-24: 100 mL via INTRAVENOUS

## 2023-03-24 MED ORDER — ESOMEPRAZOLE MAGNESIUM 40 MG PO CPDR: 40.0000 mg | DELAYED_RELEASE_CAPSULE | Freq: Two times a day (BID) | ORAL | 2 refills | Status: DC

## 2023-03-24 NOTE — Progress Notes (Addendum)
Chief Complaint: Abdominal pain  HPI:    [name] is a 52 year old female with past medical history as listed below including chronic constipation, GERD and multiple others, known to Dr. Russella Dar, who was referred to me by Trisha Mangle, * for a complaint of abdominal pain.    10/09/2016 CT of the abdomen pelvis with contrast showed that the inflamed loop of jejunum is felt to demonstrate moderate circumferential wall thickening with hazy perienteric inflammatory stranding.  This raises the possibility for partial obstruction or developing small bowel obstruction at the level of the anastomosis.    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  At that time described that when she had symptoms they could last for 6 weeks and she would miss work.  Dr. Russella Dar recommended an office visit given that epiploic appendagitis is not typically recurrent condition.    01/14/2022 EGD and colonoscopy.  EGD with normal esophagus, gastric bypass with a normal-sized pouch and gastrojejunal anastomosis characterized by ulceration.  At that time recommended Nexium 40 mg capsules sprinkled in yogurt.  Colonoscopy with one 8 mm polyp in the proximal transverse colon and otherwise normal.  Pathology showed tubular adenoma and repeat recommended in 5 years.    01/10/2023 CMP normal, TSH normal.  Hemoglobin A1c 6.0.  CBC normal.    Today, patient presents to clinic and describes that she is having attacks of what she thinks is epiploic appendagitis.  Apparently had Roux-en-Y gastric bypass back in September 2016 and shortly thereafter developed upper abdominal pain.   Tells me she had 2 CTs in the second showed a likely diagnosis of epiploic appendagitis which she was told there was not much to do about and should resolve on its own with some pain meds.  Since then she had has had attacks every couple of months which can last anywhere from 5 to 14 days.  Does tell me she went about a year with no attack at all but then over the past 3 months she has had two and is currently on day 14 of her second.  Describes these attacks as debilitating pain mostly in her upper abdomen  rated as a 10/10 at its worst, that seems to radiate down just above her umbilicus and spread to the sides that can last all day long and vary in intensity.  Oftentimes it will wake her up in the middle of the night and does not allow her to get any sleep.  Nothing over-the-counter seems to help.  The only thing that actually takes care of it is a whole Hydrocodone.  She does not like to take this much medicine as it makes her feel funky so often will take a half a pill of Hydrocodone, two Tylenol and some Advil which barely touches it.  Tells me when this pain happens she is unable to work or carry on with her day.  She has been doing a lot of research about epiploic appendagitis and feels like she should have surgery for this.  Denies any accompanying symptoms including nausea, vomiting, heartburn, reflux or change in bowel habits.  Cannot tell that any activity or food brings these episodes on.  Does tell me she ran out of her Nexium and needs more.  She was never opening the capsules.    Works as a Research officer, political [employer].    Denies fever, chills, weight loss or blood in her stool.  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  at  [facility]  Roux-en-y   Wears contact lenses     Past Surgical History:  Procedure Laterality Date   ANAL FISSURE REPAIR  07/10/2000   BUNIONECTOMY Right 2004   CARDIOVASCULAR STRESS TEST  09-21-2014  dr berry   nuclear study w/ a reversible anterior wall perfusion abnormality in the distribution of the LAD artery, consistant with ischemia (this was a 2-day study in a marked breast attenutation artifact, high likeihood of "shifting breast artifact")/  normal LV function and wall motion , ef 68%   CARPAL TUNNEL RELEASE Bilateral early 2000s   CERVICAL CONIZATION W/BX N/A 04/22/2017   Procedure: CONIZATION CERVIX WITH BIOPSY;  Surgeon: Hal Morales, MD;  Location: [facility];  Service: Gynecology;  Laterality: N/A;   COMBINED REDUCTION MAMMAPLASTY W/ ABDOMINOPLASTY  04/2009   LAPAROSCOPIC CHOLECYSTECTOMY  08/12/2003   LEFT HEART CATHETERIZATION WITH CORONARY ANGIOGRAM N/A 10/03/2014   Procedure: LEFT HEART CATHETERIZATION WITH CORONARY ANGIOGRAM;  Surgeon: Runell Gess, MD;  Location: [facility] CATH LAB;  Service: Cardiovascular;  Laterality: N/A;  essentially normal coronary arteries and normal LVF (false postitive stress test and her symptoms noncardiac)   NASAL SEPTOPLASTY W/ TURBINOPLASTY  07/14/2006   REPAIR PERIUMBILICAL HERNIA W/ MESH  03/21/2007   ROUX-EN-Y GASTRIC BYPASS  07-18-2015    [facility]   via Laparoscopy   TRANSTHORACIC ECHOCARDIOGRAM  09-21-2014   dr berry   grade 2 diastolic dysfunction, ef 55-60%/  trivial MR and TR    Current Outpatient Medications  Medication Sig Dispense Refill   atenolol (TENORMIN) 25 MG tablet Take 1 tablet (25 mg total) by mouth daily. 90 tablet 0   atorvastatin (LIPITOR) 40 MG tablet Take 1 tablet (40 mg total) by mouth daily. 90 tablet 0   esomeprazole  (NEXIUM) 40 MG capsule Take 1 capsule (40 mg total) by mouth every morning. Open capsule and sprinkle granules on top of yogurt or apple sauce instead of taking capsule whole 30 capsule 11   etonogestrel-ethinyl estradiol (NUVARING) 0.12-0.015 MG/24HR vaginal ring Place 1 each vaginally every 28 (twenty-eight) days. Insert vaginally and leave in place for 3 consecutive weeks, then remove for 1 week.     levothyroxine (SYNTHROID, LEVOTHROID) 25 MCG tablet Take 25 mcg by mouth daily before breakfast.   0   Multiple Vitamin (MULTIVITAMIN) tablet Take 1 tablet by mouth daily.     VYVANSE 50 MG capsule Take 50 mg by mouth at bedtime.     No current facility-administered medications for this visit.    Allergies as of 03/24/2023   (No Known Allergies)    Family History  Problem Relation Age of Onset   Esophageal  cancer Mother    Coronary artery disease Mother    Diabetes Father    Coronary artery disease Father    Breast cancer Maternal Grandmother    Breast cancer Paternal Grandmother    Diabetes Paternal Grandfather    Colon cancer Neg Hx    Rectal cancer Neg Hx    Stomach cancer Neg Hx     Social History   Socioeconomic History   Marital status: Divorced    Spouse name: Not on file   Number of children: Not on file   Years of education: Not on file   Highest education level: Not on file  Occupational History   Not on file  Tobacco Use   Smoking status: Never   Smokeless tobacco: Never  Vaping Use   Vaping Use: Never used  Substance and Sexual Activity   Alcohol use: Yes    Comment: Rare   Drug use: No   Sexual activity: Yes    Birth control/protection: Inserts    Comment: nuvaring  Other Topics Concern   Not on file  Social History Narrative   Not on file   Social Determinants of Health   Financial Resource Strain: Not on file  Food Insecurity: Not on file  Transportation Needs: Not on file  Physical Activity: Not on file  Stress: Not on file  Social  Connections: Not on file  Intimate Partner Violence: Not on file    Review of Systems:    Constitutional: No weight loss, fever or chills Skin: No rash  Cardiovascular: No chest pain Respiratory: No SOB  Gastrointestinal: See HPI and otherwise negative Genitourinary: No dysuria  Neurological: No headache, dizziness or syncope Musculoskeletal: No new muscle or joint pain Hematologic: No bleeding  Psychiatric: No history of depression or anxiety   Physical Exam:  Vital signs: BP (!) 146/68   Pulse 62   Ht 5\' 3"  (1.6 m)   Wt 220 lb (99.8 kg)   BMI 38.97 kg/m    Constitutional:   Pleasant obese Caucasian female appears to be in NAD, Well developed, Well nourished, alert and cooperative Head:  Normocephalic and atraumatic. Eyes:   PEERL, EOMI. No icterus. Conjunctiva pink. Ears:  Normal auditory acuity. Neck:  Supple Throat: Oral cavity and pharynx without inflammation, swelling or lesion.  Respiratory: Respirations even and unlabored. Lungs clear to auscultation bilaterally.   No wheezes, crackles, or rhonchi.  Cardiovascular: Normal S1, S2. No MRG. Regular rate and rhythm. No peripheral edema, cyanosis or pallor.  Gastrointestinal:  Soft, nondistended, moderate epigastric ttp. No rebound or guarding. Normal bowel sounds. No appreciable masses or hepatomegaly. Rectal:  Not performed.  Msk:  Symmetrical without gross deformities. Without edema, no deformity or joint abnormality.  Neurologic:  Alert and  oriented x4;  grossly normal neurologically.  Skin:   Dry and intact without significant lesions or rashes. Psychiatric: Demonstrates good judgement and reason without abnormal affect or behaviors.  See HPI for recent labs.  Assessment: 1.  Epigastric abdominal pain: For the past 6 years now off and on, initially diagnosed as epiploic appendagitis on the CT scan, but episodes come every couple of months, and can last up to 14 days with minimal relief with pain meds, EGD in 2023  with anastomotic ulcer; concern for ongoing epiploic appendagitis versus gastritis versus anastomotic ulcer versus partial obstruction versus other 2.  History of epiploic appendagitis 3.  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  Discussed with patient that the pain she is  having in these attacks would not typically correlate with a diagnosis of epiploic appendagitis which is typically self-limited and does not recur.  Though she could be in the minority of this population I think we need to make sure that nothing else is going on.  She did have a recent EGD in 2023 with anastomotic ulcer and it does not sound like she ever use the Nexium correctly for absorption. 2.  Now recommend that we repeat a stat CTAP with contrast given current pain episode.  Also repeat labs today including CBC, CMP and lipase.  If CT is unrevealing then would also consider repeat EGD during an episode of pain. 3.  Refilled Nexium 40 mg daily, patient will open capsules and sprinkle on applesauce or yogurt. 4.  Patient to follow in clinic per recommendations after imaging and labs above.  Hyacinth Meeker, PA-C [facility] 03/24/2023, 8:29 AM  Cc: Trisha Mangle, *

## 2023-03-24 NOTE — Telephone Encounter (Signed)
Received a call from [facility] with [facility]. Please see report below.  IMPRESSION: 1. Mild inflammatory changes near the gastrojejunal anastomosis and mild wall thickening involving the Roux limb. Some of these findings may be chronic since 2017 as described. However, cannot exclude an anastomotic ulceration. Consider further evaluation with endoscopy. 2. No evidence for a bowel obstruction 3.  Aortic Atherosclerosis (ICD10-I70.0).

## 2023-03-24 NOTE — Patient Instructions (Signed)
You have been scheduled for a CT scan of the abdomen and pelvis at [facility], 1st floor Radiology. You are scheduled on 03/24/2023 at 11 am. You should arrive 15 minutes prior to your appointment time for registration.  We are giving you 2 bottles of contrast today that you will need to drink before arriving for the exam. The solution may taste better if refrigerated so put them in the refrigerator when you get home, but do NOT add ice or any other liquid to this solution as that would dilute it. Shake well before drinking.    Your provider has requested that you go to the basement level for lab work before leaving today. Press "B" on the elevator. The lab is located at the first door on the left as you exit the elevator.   We have sent the following medications to your pharmacy for you to pick up at your convenience: Nexium  Due to recent changes in healthcare laws, you may see the results of your imaging and laboratory studies on MyChart before your provider has had a chance to review them.  We understand that in some cases there may be results that are confusing or concerning to you. Not all laboratory results come back in the same time frame and the provider may be waiting for multiple results in order to interpret others.  Please give [place] 48 hours in order for your provider to thoroughly review all the results before contacting the office for clarification of your results. \  I appreciate the  opportunity to care for you  Thank You    [facility]

## 2023-03-25 ENCOUNTER — Telehealth: Payer: Self-pay | Admitting: Physician Assistant

## 2023-03-25 ENCOUNTER — Other Ambulatory Visit: Payer: Self-pay

## 2023-03-25 DIAGNOSIS — R1013 Epigastric pain: Secondary | ICD-10-CM

## 2023-03-25 DIAGNOSIS — K289 Gastrojejunal ulcer, unspecified as acute or chronic, without hemorrhage or perforation: Secondary | ICD-10-CM

## 2023-03-25 NOTE — Telephone Encounter (Signed)
Patient is returning your call.  

## 2023-03-25 NOTE — Telephone Encounter (Signed)
Spoke with patient. See 03/25/23 CT result note for details.

## 2023-04-04 ENCOUNTER — Encounter: Payer: PRIVATE HEALTH INSURANCE | Admitting: Internal Medicine

## 2023-04-09 ENCOUNTER — Ambulatory Visit (AMBULATORY_SURGERY_CENTER): Payer: PRIVATE HEALTH INSURANCE | Admitting: Gastroenterology

## 2023-04-09 ENCOUNTER — Encounter: Payer: Self-pay | Admitting: Gastroenterology

## 2023-04-09 VITALS — BP 105/66 | HR 62 | Temp 98.4°F | Resp 18 | Ht 63.0 in | Wt 220.0 lb

## 2023-04-09 DIAGNOSIS — K219 Gastro-esophageal reflux disease without esophagitis: Secondary | ICD-10-CM

## 2023-04-09 DIAGNOSIS — R1013 Epigastric pain: Secondary | ICD-10-CM

## 2023-04-09 DIAGNOSIS — K289 Gastrojejunal ulcer, unspecified as acute or chronic, without hemorrhage or perforation: Secondary | ICD-10-CM | POA: Diagnosis present

## 2023-04-09 DIAGNOSIS — K9189 Other postprocedural complications and disorders of digestive system: Secondary | ICD-10-CM | POA: Diagnosis not present

## 2023-04-09 DIAGNOSIS — R933 Abnormal findings on diagnostic imaging of other parts of digestive tract: Secondary | ICD-10-CM

## 2023-04-09 MED ORDER — SODIUM CHLORIDE 0.9 % IV SOLN
500.0000 mL | Freq: Once | INTRAVENOUS | Status: AC
Start: 2023-04-09 — End: ?

## 2023-04-09 MED ORDER — SUCRALFATE 1 G PO TABS
1.0000 g | ORAL_TABLET | Freq: Three times a day (TID) | ORAL | 3 refills | Status: AC
Start: 2023-04-09 — End: ?

## 2023-04-09 NOTE — Op Note (Signed)
[facility] Patient Name: [name] Procedure Date: 04/09/2023 10:01 AM MRN: [id number] Endoscopist: Meryl Dare , MD, [phone] Age: 52 Referring MD:  Date of Birth: November 25, 1970 Gender: Female Account #: [id number] Procedure:                Upper GI endoscopy Indications:              Epigastric abdominal pain, Abnormal CT of the GI                            tract Medicines:                Monitored Anesthesia Care Procedure:                Pre-Anesthesia Assessment:                           - Prior to the procedure, a History and Physical                            was performed, and patient medications and                            allergies were reviewed. The patient's tolerance of                            previous anesthesia was also reviewed. The risks                            and benefits of the procedure and the sedation                            options and risks were discussed with the patient.                            All questions were answered, and informed consent                            was obtained. Prior Anticoagulants: The patient has                            taken no anticoagulant or antiplatelet agents. ASA                            Grade Assessment: II - A patient with mild systemic                            disease. After reviewing the risks and benefits,                            the patient was deemed in satisfactory condition to                            undergo the procedure.  After obtaining informed consent, the endoscope was                            passed under direct vision. Throughout the                            procedure, the patient's blood pressure, pulse, and                            oxygen saturations were monitored continuously. The                            Olympus Scope [id number] was introduced through the                            mouth, and advanced to the efferent jejunal  loop.                            The upper GI endoscopy was accomplished without                            difficulty. The patient tolerated the procedure                            well. Scope In: Scope Out: Findings:                 The examined esophagus was normal.                           Evidence of a Roux-en-Y gastrojejunostomy was found                            with a normal sized pouch. The gastrojejunal                            anastomosis was characterized by ulceration                            measuring 7 mm, clean based. Biopsied. This was                            traversed. 3 stables noted at anastomosis.                            Remainder of the stomach was normal. Efferent limb                            normal. Afferent limb was not found. Complications:            No immediate complications. Estimated Blood Loss:     Estimated blood loss was minimal. Impression:               - Normal esophagus.                           -  Roux-en-Y gastrojejunostomy with gastrojejunal                            anastomosis characterized by ulceration. Biopsied.                           - Efferent limb appeared normal. Recommendation:           - Patient has a contact number available for                            emergencies. The signs and symptoms of potential                            delayed complications were discussed with the                            patient. Return to normal activities tomorrow.                            Written discharge instructions were provided to the                            patient.                           - Resume previous diet.                           - Continue present medications including Nexium 40                            mg po bid, open capsule sprinkle granules in apple                            sause, yogurt.                           - Start Carafate 1 g po tid, 3 month supply                           - Await  pathology results.                           - No aspirin, ibuprofen, naproxen, or other                            non-steroidal anti-inflammatory drugs long term. Meryl Dare, MD 04/09/2023 10:31:46 AM This report has been signed electronically.

## 2023-04-09 NOTE — Progress Notes (Signed)
Pt's states no medical or surgical changes since previsit or office visit. VS assessed by Minerva Areola

## 2023-04-09 NOTE — Patient Instructions (Addendum)
Continue present medications including Nexium 40 mg po twice daily, open capsule sprinkle granules in apple sause, yogurt. Start Carafate 1 g po three times daily, 3 month supply                         Await pathology results. No aspirin, ibuprofen, naproxen, or other non-steroidal anti-inflammatory drugs long term.                             YOU HAD AN ENDOSCOPIC PROCEDURE TODAY AT THE [facility]:   Refer to the procedure report that was given to you for any specific questions about what was found during the examination.  If the procedure report does not answer your questions, please call your gastroenterologist to clarify.  If you requested that your care partner not be given the details of your procedure findings, then the procedure report has been included in a sealed envelope for you to review at your convenience later.  YOU SHOULD EXPECT: Some feelings of bloating in the abdomen. Passage of more gas than usual.  Walking can help get rid of the air that was put into your GI tract during the procedure and reduce the bloating.   Please Note:  You might notice some irritation and congestion in your nose or some drainage.  This is from the oxygen used during your procedure.  There is no need for concern and it should clear up in a day or so.  SYMPTOMS TO REPORT IMMEDIATELY:  Following upper endoscopy (EGD)  Vomiting of blood or coffee ground material  New chest pain or pain under the shoulder blades  Painful or persistently difficult swallowing  New shortness of breath  Fever of 100F or higher  Black, tarry-looking stools  For urgent or emergent issues, a gastroenterologist can be reached at any hour by calling [phone]. Do not use MyChart messaging for urgent concerns.    DIET:  We do recommend a small meal at first, but then you may proceed to your regular diet.  Drink plenty of fluids but you should avoid alcoholic beverages for 24 hours.  ACTIVITY:  You should  plan to take it easy for the rest of today and you should NOT DRIVE or use heavy machinery until tomorrow (because of the sedation medicines used during the test).    FOLLOW UP: Our staff will call the number listed on your records the next business day following your procedure.  We will call around 7:15- 8:00 am to check on you and address any questions or concerns that you may have regarding the information given to you following your procedure. If we do not reach you, we will leave a message.     If any biopsies were taken you will be contacted by phone or by letter within the next 1-3 weeks.  Please call us at [phone] if you have not heard about the biopsies in 3 weeks.    SIGNATURES/CONFIDENTIALITY: You and/or your care partner have signed paperwork which will be entered into your electronic medical record.  These signatures attest to the fact that that the information above on your After Visit Summary has been reviewed and is understood.  Full responsibility of the confidentiality of this discharge information lies with you and/or your care-partner.

## 2023-04-09 NOTE — Progress Notes (Signed)
Patient did require a chin lift and a jaw thrust. Transient O2 desaturation during procedure. No adverse events. Otherwise, uneventful anesthetic. Report to pacu rn. Vss on Room air. Care resumed by rn.

## 2023-04-09 NOTE — Progress Notes (Signed)
See 03/24/2023 H&P, no changes

## 2023-04-09 NOTE — Progress Notes (Signed)
Called to room to assist during endoscopic procedure.  Patient ID and intended procedure confirmed with present staff. Received instructions for my participation in the procedure from the performing physician.  

## 2023-04-10 ENCOUNTER — Telehealth: Payer: Self-pay | Admitting: *Deleted

## 2023-04-10 NOTE — Telephone Encounter (Signed)
  Follow up Call-     04/09/2023    9:35 AM 01/14/2022    1:57 PM  Call back number  Post procedure Call Back phone  # [phone]  Permission to leave phone message Yes Yes     Patient questions:   Message lefty to call if necessary.

## 2023-04-17 ENCOUNTER — Encounter: Payer: Self-pay | Admitting: Gastroenterology

## 2023-04-18 ENCOUNTER — Encounter: Payer: Self-pay | Admitting: Gastroenterology

## 2023-06-02 ENCOUNTER — Ambulatory Visit: Payer: PRIVATE HEALTH INSURANCE | Admitting: Gastroenterology

## 2023-07-07 MED ORDER — ESOMEPRAZOLE MAGNESIUM 40 MG PO CPDR: 40.0000 mg | DELAYED_RELEASE_CAPSULE | Freq: Two times a day (BID) | ORAL | 2 refills | Status: DC

## 2023-07-23 ENCOUNTER — Other Ambulatory Visit: Payer: Self-pay | Admitting: *Deleted

## 2023-07-23 MED ORDER — ESOMEPRAZOLE MAGNESIUM 40 MG PO CPDR: 40.0000 mg | DELAYED_RELEASE_CAPSULE | Freq: Two times a day (BID) | ORAL | 2 refills | Status: DC

## 2023-07-28 MED ORDER — ESOMEPRAZOLE MAGNESIUM 40 MG PO CPDR: 40.0000 mg | DELAYED_RELEASE_CAPSULE | Freq: Two times a day (BID) | ORAL | 2 refills | Status: DC

## 2023-09-29 ENCOUNTER — Ambulatory Visit (INDEPENDENT_AMBULATORY_CARE_PROVIDER_SITE_OTHER): Payer: PRIVATE HEALTH INSURANCE | Admitting: Orthopedic Surgery

## 2023-09-29 ENCOUNTER — Other Ambulatory Visit (INDEPENDENT_AMBULATORY_CARE_PROVIDER_SITE_OTHER): Payer: PRIVATE HEALTH INSURANCE

## 2023-09-29 DIAGNOSIS — M65961 Unspecified synovitis and tenosynovitis, right lower leg: Secondary | ICD-10-CM | POA: Diagnosis not present

## 2023-09-29 DIAGNOSIS — M25561 Pain in right knee: Secondary | ICD-10-CM

## 2023-09-30 ENCOUNTER — Encounter: Payer: Self-pay | Admitting: Orthopedic Surgery

## 2023-09-30 NOTE — Progress Notes (Unsigned)
Office Visit Note   Patient: [name]           Date of Birth: March 30, 1971           MRN: [id number] Visit Date: 09/29/2023 Requested by: Trisha Mangle, FNP [address]  [place] [address] PCP: Trisha Mangle, FNP  Subjective: Chief Complaint  Patient presents with   Right Knee - Pain    HPI: [name] is a 52 y.o. female who presents to the office reporting right knee pain.  Patient states she had pain developed a week Thanksgiving.  She is using Voltaren gel.  Past week is getting worse.  She reports pain and discomfort.  Wakes her up in the night.  She does sitdown work at home.  Not really supposed to take Advil because she has had gastric bypass over 5 years ago.  She had similar pain 8 years ago.  Her eye scan at that time demonstrated possible meniscal tear along with patellofemoral arthritis as well as moderate to prominent degenerative chondral thinning in the medial compartment osteochondral fragment posterior to the PCL was noted at that time is.  Currently she localizes the pain primarily in the posterior lateral and active the knee.  She has tried trigger point massage.  Not too much in terms of anterior or medial pain.  Denies any swelling.  Also has a history of low back pain with right-sided symptoms.  Had injections in November and April which have helped.  Oral prednisone causes insomnia.  Patient states she has been limping..                ROS: All systems reviewed are negative as they relate to the chief complaint within the history of present illness.  Patient denies fevers or chills.  Assessment & Plan: Visit Diagnoses:  1. Acute pain of right knee     Plan: Impression is right knee pain.  Most of her pain currently is on the lateral side of the knee whereas the previous structural problems were on the medial side of the knee 8 years ago.  She is having more lateral sided symptoms.  Could be referred pain from the back less  likely.  Symptoms ongoing now for slightly less than 6 weeks but they are debilitating and constant.  She has tried some nonweightbearing quad strengthening exercises improvement.  Plain radiographs presently show minimal degenerative changes and joint space narrowing in the knee.  I think is possible she may have lateral meniscal pathology.  The osteochondral fragment posterior to the PCL appears to be unchanged in position compared to previous radiographs.  Plan is injection into the knee per patient request for pain relief as well as MRI scan of the right knee to evaluate lateral meniscal pathology.  Follow-up after that study.  Follow-Up Instructions: No follow-ups on file.   Orders:  Orders Placed This Encounter  Procedures   XR KNEE 3 VIEW RIGHT   MR Knee Right w/o contrast   No orders of the defined types were placed in this encounter.     Procedures: Large Joint Inj on 09/29/2023 6:27 AM Indications: diagnostic evaluation, joint swelling and pain Details: 18 G 1.5 in needle, superolateral approach  Arthrogram: No  Medications: 5 mL lidocaine 1 %; 40 mg methylPREDNISolone acetate 40 MG/ML; 4 mL bupivacaine 0.25 % Outcome: tolerated well, no immediate complications Procedure, treatment alternatives, risks and benefits explained, specific risks discussed. Consent was given by the patient. Immediately prior to  procedure a time out was called to verify the correct patient, procedure, equipment, support staff and site/side marked as required. Patient was prepped and draped in the usual sterile fashion.       Clinical Data: No additional findings.  Objective: Vital Signs: There were no vitals taken for this visit.  Physical Exam:  Constitutional: Patient appears well-developed HEENT:  Head: Normocephalic Eyes:EOM are normal Neck: Normal range of motion Cardiovascular: Normal rate Pulmonary/chest: Effort normal Neurologic: Patient is alert Skin: Skin is warm Psychiatric:  Patient has normal mood and affect  Ortho Exam: Ortho exam demonstrates antalgic gait to the right.  No knee effusion.  Lateral greater than medial joint line tenderness.  Extensor mechanism is intact and nontender.  No real fibular head tenderness or instability.  Collateral cruciate ligaments are stable.  No groin pain with internal/external Tatian of the leg.  No calf tenderness and negative Homans.  McMurray compression testing equivocal for lateral sided pathology on the right negative on the left.  Not much in terms of patellofemoral crepitus.  Specialty Comments:  No specialty comments available.  Imaging: No results found.   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) 03/25/2019   Weight gain following gastric bypass surgery 06/19/2018   Generalized abdominal pain 07/21/2017   PMDD (premenstrual dysphoric disorder) 04/22/2017   Abnormal Pap smear of cervix 04/22/2017   Pain in left ankle and joints of left foot 11/29/2016   Radiculopathy due to lumbar intervertebral disc disorder 10/02/2016   Family history of coronary artery disease 09/07/2014   DOE (dyspnea on exertion) 09/07/2014   Hypothyroid 09/07/2014   Back pain 09/07/2014   Excessive sweating 08/04/2014   Essential hypertension 10/18/2013   CAD (coronary artery disease), non obstrustive by cardiac CT 07/28/2012   Chest pain with moderate risk of acute coronary syndrome 07/27/2012   Hyperlipidemia 07/27/2012   Menopausal symptom 02/06/2012   Fatigue 02/06/2012   Morbid obesity (HCC) 02/06/2012   Bariatric surgery status 02/06/2012   Past Medical History:  Diagnosis Date   Abnormal cytological finding in specimen from cervix    Allergy    Arthritis     Chronic constipation    Complication of anesthesia    post-op  urinary retention (per pt happens after every surgery)   Depression    Family history of coronary artery disease    GERD (gastroesophageal reflux disease)    Heart murmur    History of gastric ulcer    Hyperlipidemia    Hypertension    Hypothyroidism    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  at  [facility]  Roux-en-y   Wears contact lenses     Family History  Problem Relation Age of Onset   Esophageal cancer Mother    Coronary artery disease Mother    Diabetes Father    Coronary artery disease Father    Breast cancer Maternal Grandmother    Breast cancer Paternal Grandmother    Diabetes Paternal Grandfather    Colon cancer Neg Hx    Rectal cancer Neg Hx    Stomach cancer Neg Hx     Past Surgical History:  Procedure Laterality Date   ANAL FISSURE REPAIR  07/10/2000   BUNIONECTOMY Right 2004   CARDIOVASCULAR STRESS TEST  09-21-2014  dr berry   nuclear study w/ a reversible anterior wall perfusion abnormality in the distribution of the LAD artery, consistant with ischemia (this was a 2-day study in a marked breast attenutation artifact, high likeihood of "shifting breast artifact")/  normal LV function and wall motion , ef 68%   CARPAL TUNNEL RELEASE Bilateral early 2000s   CERVICAL CONIZATION W/BX N/A 04/22/2017   Procedure: CONIZATION CERVIX WITH BIOPSY;  Surgeon: Hal Morales, MD;  Location: [facility];  Service: Gynecology;  Laterality: N/A;   COMBINED REDUCTION MAMMAPLASTY W/ ABDOMINOPLASTY  04/2009   LAPAROSCOPIC CHOLECYSTECTOMY  08/12/2003   LEFT HEART CATHETERIZATION WITH CORONARY ANGIOGRAM N/A 10/03/2014   Procedure: LEFT HEART CATHETERIZATION WITH CORONARY ANGIOGRAM;  Surgeon: Runell Gess, MD;  Location: [facility] CATH LAB;  Service: Cardiovascular;  Laterality: N/A;  essentially normal coronary arteries and normal LVF (false postitive stress test and her symptoms noncardiac)   NASAL SEPTOPLASTY  W/ TURBINOPLASTY  07/14/2006   REPAIR PERIUMBILICAL HERNIA W/ MESH  03/21/2007   ROUX-EN-Y GASTRIC BYPASS  07-18-2015    [facility]   via Laparoscopy   TRANSTHORACIC ECHOCARDIOGRAM  09-21-2014   dr berry   grade 2 diastolic dysfunction, ef 55-60%/  trivial MR and TR   Social History   Occupational History   Not on file  Tobacco Use   Smoking status: Never   Smokeless tobacco: Never  Vaping Use   Vaping status: Never Used  Substance and Sexual Activity   Alcohol use: Yes    Comment: Rare   Drug use: No   Sexual activity: Yes    Birth control/protection: Inserts    Comment: nuvaring

## 2023-10-02 ENCOUNTER — Telehealth: Payer: Self-pay | Admitting: Orthopedic Surgery

## 2023-10-02 ENCOUNTER — Encounter: Payer: Self-pay | Admitting: Orthopedic Surgery

## 2023-10-02 MED ORDER — METHYLPREDNISOLONE ACETATE 40 MG/ML IJ SUSP
40.0000 mg | INTRAMUSCULAR | Status: AC | PRN
Start: 2023-09-29 — End: 2023-09-29
  Administered 2023-09-29: 40 mg via INTRA_ARTICULAR

## 2023-10-02 MED ORDER — LIDOCAINE HCL 1 % IJ SOLN
5.0000 mL | INTRAMUSCULAR | Status: AC | PRN
Start: 2023-09-29 — End: 2023-09-29
  Administered 2023-09-29: 5 mL

## 2023-10-02 MED ORDER — BUPIVACAINE HCL 0.25 % IJ SOLN
4.0000 mL | INTRAMUSCULAR | Status: AC | PRN
Start: 2023-09-29 — End: 2023-09-29
  Administered 2023-09-29: 4 mL via INTRA_ARTICULAR

## 2023-10-02 NOTE — Telephone Encounter (Signed)
Patient needs a MRI review next week.

## 2023-10-03 NOTE — Telephone Encounter (Signed)
Sure can you remind me thx

## 2023-10-05 ENCOUNTER — Ambulatory Visit
Admission: RE | Admit: 2023-10-05 | Discharge: 2023-10-05 | Disposition: A | Discharge status: Home / Self Care | Payer: PRIVATE HEALTH INSURANCE | Source: Ambulatory Visit | Attending: Orthopedic Surgery | Admitting: Orthopedic Surgery

## 2023-10-05 DIAGNOSIS — M25561 Pain in right knee: Secondary | ICD-10-CM

## 2023-10-06 ENCOUNTER — Other Ambulatory Visit: Payer: PRIVATE HEALTH INSURANCE

## 2023-10-07 NOTE — Telephone Encounter (Signed)
I called and went over her scan with her.  She can you make an appointment to come in in 3 months.  Thanks

## 2023-10-13 ENCOUNTER — Ambulatory Visit: Payer: PRIVATE HEALTH INSURANCE | Admitting: Orthopedic Surgery

## 2023-10-17 ENCOUNTER — Other Ambulatory Visit: Payer: PRIVATE HEALTH INSURANCE

## 2023-10-24 NOTE — Telephone Encounter (Signed)
 I am not sure exactly what Dr. Addie discussed with her about her MRI scan.  Sounds like she is having more medial sided pain which does correlate with her MRI findings as opposed to the lateral sided pain she was having before.  May be back related due to the fairly significant endplate degenerative changes at L3-L4 along with facet arthritis at that level noted on CT abdomen and pelvis previously.  I think that she can try anti-inflammatories and Tylenol  in addition to the ice and elevation.  Could try gabapentin to see if this helps if she wants to try prescription for this.  If that knee injection helped and this is more knee related rather than back related, she could try compression knee sleeve or hinged knee brace.

## 2023-11-10 ENCOUNTER — Ambulatory Visit: Payer: Managed Care, Other (non HMO) | Admitting: Orthopedic Surgery

## 2023-11-10 DIAGNOSIS — M25561 Pain in right knee: Secondary | ICD-10-CM

## 2023-11-11 ENCOUNTER — Encounter: Payer: Self-pay | Admitting: Orthopedic Surgery

## 2023-11-11 NOTE — Progress Notes (Unsigned)
Office Visit Note   Patient: [name]           Date of Birth: [birth year]           MRN: [id number] Visit Date: 11/10/2023 Requested by: Trisha Mangle, FNP No address on file PCP: Trisha Mangle, FNP  Subjective: Chief Complaint  Patient presents with   Right Knee - Follow-up    MRI review    HPI: [name] is a 53 y.o. female who presents to the office reporting right knee pain.  She did have some medial sided pain at the end of December but the injection in her knee did help.  Currently she is in a state where she can live with her symptoms.  She does have radial tear of the root of the posterior horn medial meniscus with some peripheral meniscal extrusion.  She has only mild partial-thickness cartilage loss on that medial side..                ROS: All systems reviewed are negative as they relate to the chief complaint within the history of present illness.  Patient denies fevers or chills.  Assessment & Plan: Visit Diagnoses:  1. Acute pain of right knee     Plan: Impression is currently relatively asymptomatic meniscal root tear in the right knee.  She does not have much arthritis in that medial compartment which is a good sign.  I think we need to check her every 3 months for effusion in that knee and also to see if she is maintaining her relatively asymptomatic clinical state.  If she develops any effusion or worsening pain then attempt at meniscal root repair is indicated to delay her need for later knee replacement particularly while the cartilage remains relatively healthy.  She will come back in 3 months for clinical recheck.  Follow-Up Instructions: No follow-ups on file.   Orders:  No orders of the defined types were placed in this encounter.  No orders of the defined types were placed in this encounter.     Procedures: No procedures performed   Clinical Data: No additional findings.  Objective: Vital Signs: There were no  vitals taken for this visit.  Physical Exam:  Constitutional: Patient appears well-developed HEENT:  Head: Normocephalic Eyes:EOM are normal Neck: Normal range of motion Cardiovascular: Normal rate Pulmonary/chest: Effort normal Neurologic: Patient is alert Skin: Skin is warm Psychiatric: Patient has normal mood and affect  Ortho Exam: Ortho exam demonstrates full range of motion of the right knee.  No effusion.  Does have mild medial joint line tenderness but stable collateral cruciate ligaments.  No groin pain with internal/external Tatian of the leg and no nerve root tension signs.  Specialty Comments:  No specialty comments available.  Imaging: No results found.   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) 03/25/2019   Weight gain following gastric bypass surgery 06/19/2018   Generalized abdominal pain 07/21/2017   PMDD (premenstrual dysphoric disorder) 04/22/2017   Abnormal Pap smear of cervix 04/22/2017   Pain in left ankle and joints of left foot 11/29/2016   Radiculopathy due to lumbar intervertebral disc disorder 10/02/2016   Family history of coronary artery disease 09/07/2014   DOE (dyspnea on exertion) 09/07/2014  Hypothyroid 09/07/2014   Back pain 09/07/2014   Excessive sweating 08/04/2014   Essential hypertension 10/18/2013   CAD (coronary artery disease), non obstrustive by cardiac CT 07/28/2012   Chest pain with moderate risk of acute coronary syndrome 07/27/2012   Hyperlipidemia 07/27/2012   Menopausal symptom 02/06/2012   Fatigue 02/06/2012   Morbid obesity (HCC) 02/06/2012   Bariatric surgery status 02/06/2012   Past Medical History:  Diagnosis Date   Abnormal cytological finding in  specimen from cervix    Allergy    Arthritis    Chronic constipation    Complication of anesthesia    post-op  urinary retention (per pt happens after every surgery)   Depression    Family history of coronary artery disease    GERD (gastroesophageal reflux disease)    Heart murmur    History of gastric ulcer    Hyperlipidemia    Hypertension    Hypothyroidism    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  at  [facility]  Roux-en-y   Wears contact lenses     Family History  Problem Relation Age of Onset   Esophageal cancer Mother    Coronary artery disease Mother    Diabetes Father    Coronary artery disease Father    Breast cancer Maternal Grandmother    Breast cancer Paternal Grandmother    Diabetes Paternal Grandfather    Colon cancer Neg Hx    Rectal cancer Neg Hx    Stomach cancer Neg Hx     Past Surgical History:  Procedure Laterality Date   ANAL FISSURE REPAIR  07/10/2000   BUNIONECTOMY Right 2004   CARDIOVASCULAR STRESS TEST  09-21-2014  dr berry   nuclear study w/ a reversible anterior wall perfusion abnormality in the distribution of the LAD artery, consistant with ischemia (this was a 2-day study in a marked breast attenutation artifact, high likeihood of "shifting breast artifact")/  normal LV function and wall motion , ef 68%   CARPAL TUNNEL RELEASE Bilateral early 2000s   CERVICAL CONIZATION W/BX N/A 04/22/2017   Procedure: CONIZATION CERVIX WITH BIOPSY;  Surgeon: Hal Morales, MD;  Location: [facility];  Service: Gynecology;  Laterality: N/A;   COMBINED REDUCTION MAMMAPLASTY W/ ABDOMINOPLASTY  04/2009   LAPAROSCOPIC CHOLECYSTECTOMY  08/12/2003   LEFT HEART CATHETERIZATION WITH CORONARY ANGIOGRAM N/A 10/03/2014   Procedure: LEFT HEART CATHETERIZATION WITH CORONARY ANGIOGRAM;  Surgeon: Runell Gess, MD;  Location: [facility] CATH LAB;  Service: Cardiovascular;  Laterality: N/A;  essentially normal coronary arteries and normal LVF (false postitive stress test  and her symptoms noncardiac)   NASAL SEPTOPLASTY W/ TURBINOPLASTY  07/14/2006   REPAIR PERIUMBILICAL HERNIA W/ MESH  03/21/2007   ROUX-EN-Y GASTRIC BYPASS  07-18-2015    [facility]   via Laparoscopy   TRANSTHORACIC ECHOCARDIOGRAM  09-21-2014   dr berry   grade 2 diastolic dysfunction, ef 55-60%/  trivial MR and TR   Social History   Occupational History   Not on file  Tobacco Use   Smoking status: Never   Smokeless tobacco: Never  Vaping Use   Vaping status: Never Used  Substance and Sexual Activity   Alcohol use: Yes    Comment: Rare   Drug use: No   Sexual activity: Yes    Birth control/protection: Inserts    Comment: nuvaring

## 2023-12-30 ENCOUNTER — Encounter: Payer: Self-pay | Admitting: Orthopedic Surgery

## 2023-12-30 NOTE — Telephone Encounter (Signed)
 We do treat this problem and we can see her for this.  I think before any prescription, I would recommend getting an evaluation and we can figure out if she would respond best to some sort of steroid injection for her shoulder pain or if she does need a steroid Dosepak or an MRI scan if it is coming from her neck.

## 2024-01-30 ENCOUNTER — Other Ambulatory Visit: Payer: Self-pay

## 2024-01-30 ENCOUNTER — Other Ambulatory Visit: Payer: Self-pay | Admitting: Rehabilitation

## 2024-01-30 DIAGNOSIS — M5412 Radiculopathy, cervical region: Secondary | ICD-10-CM

## 2024-02-09 ENCOUNTER — Other Ambulatory Visit

## 2024-02-20 ENCOUNTER — Other Ambulatory Visit: Payer: Self-pay | Admitting: Orthopaedic Surgery

## 2024-02-23 ENCOUNTER — Ambulatory Visit
Admission: RE | Admit: 2024-02-23 | Discharge: 2024-02-23 | Disposition: A | Discharge status: Home / Self Care | Source: Ambulatory Visit

## 2024-02-23 DIAGNOSIS — M5412 Radiculopathy, cervical region: Secondary | ICD-10-CM

## 2024-04-06 ENCOUNTER — Other Ambulatory Visit: Payer: Self-pay | Admitting: Physician Assistant

## 2024-08-23 ENCOUNTER — Encounter: Payer: Self-pay | Admitting: Radiology
